# Patient Record
Sex: Female | Born: 1935 | Race: Black or African American | Hispanic: No | Marital: Single | Smoking: Never smoker
Health system: Southern US, Community
[De-identification: ages and names within clinical notes are randomized; demographics above are authoritative.]

## PROBLEM LIST (undated history)

## (undated) DIAGNOSIS — E119 Type 2 diabetes mellitus without complications: Secondary | ICD-10-CM

## (undated) DIAGNOSIS — E213 Hyperparathyroidism, unspecified: Secondary | ICD-10-CM

## (undated) DIAGNOSIS — M810 Age-related osteoporosis without current pathological fracture: Secondary | ICD-10-CM

## (undated) DIAGNOSIS — Z8679 Personal history of other diseases of the circulatory system: Secondary | ICD-10-CM

## (undated) DIAGNOSIS — I10 Essential (primary) hypertension: Secondary | ICD-10-CM

## (undated) DIAGNOSIS — E785 Hyperlipidemia, unspecified: Secondary | ICD-10-CM

## (undated) DIAGNOSIS — G5600 Carpal tunnel syndrome, unspecified upper limb: Secondary | ICD-10-CM

## (undated) HISTORY — DX: Carpal tunnel syndrome, unspecified upper limb: G56.00

## (undated) HISTORY — DX: Hyperlipidemia, unspecified: E78.5

## (undated) HISTORY — PX: ABDOMINAL HYSTERECTOMY: SHX81

## (undated) HISTORY — DX: Essential (primary) hypertension: I10

## (undated) HISTORY — PX: OTHER SURGICAL HISTORY: SHX169

## (undated) HISTORY — DX: Personal history of other diseases of the circulatory system: Z86.79

## (undated) HISTORY — DX: Age-related osteoporosis without current pathological fracture: M81.0

## (undated) HISTORY — DX: Type 2 diabetes mellitus without complications: E11.9

## (undated) HISTORY — DX: Hyperparathyroidism, unspecified: E21.3

## (undated) HISTORY — PX: PARATHYROIDECTOMY: SHX19

---

## 2004-05-31 ENCOUNTER — Ambulatory Visit: Payer: Self-pay | Admitting: Internal Medicine

## 2004-11-12 ENCOUNTER — Ambulatory Visit: Payer: Self-pay | Admitting: Internal Medicine

## 2004-11-15 ENCOUNTER — Ambulatory Visit: Payer: Self-pay | Admitting: Internal Medicine

## 2004-12-21 ENCOUNTER — Ambulatory Visit: Payer: Self-pay | Admitting: Internal Medicine

## 2004-12-24 ENCOUNTER — Ambulatory Visit: Payer: Self-pay | Admitting: Gastroenterology

## 2005-01-11 ENCOUNTER — Ambulatory Visit: Payer: Self-pay | Admitting: Internal Medicine

## 2005-01-15 ENCOUNTER — Ambulatory Visit: Payer: Self-pay | Admitting: Internal Medicine

## 2005-02-15 ENCOUNTER — Ambulatory Visit: Payer: Self-pay | Admitting: Internal Medicine

## 2006-01-05 ENCOUNTER — Ambulatory Visit: Payer: Self-pay | Admitting: Internal Medicine

## 2007-03-29 ENCOUNTER — Ambulatory Visit: Payer: Self-pay | Admitting: Internal Medicine

## 2008-04-01 ENCOUNTER — Ambulatory Visit: Payer: Self-pay | Admitting: Internal Medicine

## 2009-04-02 ENCOUNTER — Ambulatory Visit: Payer: Self-pay | Admitting: Internal Medicine

## 2009-04-13 ENCOUNTER — Ambulatory Visit: Payer: Self-pay | Admitting: Internal Medicine

## 2009-11-04 ENCOUNTER — Ambulatory Visit: Payer: Self-pay | Admitting: Internal Medicine

## 2010-04-26 ENCOUNTER — Ambulatory Visit: Payer: Self-pay | Admitting: Internal Medicine

## 2010-08-25 ENCOUNTER — Ambulatory Visit: Payer: Self-pay | Admitting: Gastroenterology

## 2010-09-06 ENCOUNTER — Inpatient Hospital Stay: Payer: Self-pay | Admitting: Internal Medicine

## 2011-11-01 ENCOUNTER — Ambulatory Visit: Payer: Self-pay | Admitting: Internal Medicine

## 2011-11-01 LAB — HM MAMMOGRAPHY

## 2011-11-01 LAB — HM DEXA SCAN

## 2012-01-31 LAB — LIPID PANEL
Cholesterol: 238 mg/dL — AB (ref 0–200)
Triglycerides: 155 mg/dL (ref 40–160)

## 2012-01-31 LAB — BASIC METABOLIC PANEL: Sodium: 139 mmol/L (ref 137–147)

## 2012-01-31 LAB — CBC AND DIFFERENTIAL
HCT: 36 % (ref 36–46)
Hemoglobin: 12.1 g/dL (ref 12.0–16.0)
Platelets: 247 10*3/uL (ref 150–399)

## 2012-01-31 LAB — HEMOGLOBIN A1C: Hgb A1c MFr Bld: 7.1 % — AB (ref 4.0–6.0)

## 2012-02-22 ENCOUNTER — Ambulatory Visit: Payer: Self-pay | Admitting: Unknown Physician Specialty

## 2012-05-11 LAB — HM DIABETES EYE EXAM

## 2012-05-18 ENCOUNTER — Telehealth: Payer: Self-pay | Admitting: Internal Medicine

## 2012-05-18 NOTE — Telephone Encounter (Signed)
Refill request for januvia tab 100 mg Sig: take 1 tablet by mouth daily Patient does have an appt. On 1/717/14

## 2012-05-23 MED ORDER — PANTOPRAZOLE SODIUM 40 MG PO TBEC: 40.0000 mg | DELAYED_RELEASE_TABLET | Freq: Every day | ORAL | Status: DC

## 2012-05-23 MED ORDER — SITAGLIPTIN PHOSPHATE 100 MG PO TABS: 100.0000 mg | ORAL_TABLET | Freq: Every day | ORAL | Status: DC

## 2012-05-24 NOTE — Telephone Encounter (Signed)
Called to pharmacy by Ermalinda Barrios. 11/6

## 2012-08-02 ENCOUNTER — Encounter: Payer: Self-pay | Admitting: *Deleted

## 2012-08-03 ENCOUNTER — Ambulatory Visit (INDEPENDENT_AMBULATORY_CARE_PROVIDER_SITE_OTHER): Payer: Medicare Other | Admitting: Internal Medicine

## 2012-08-03 ENCOUNTER — Encounter: Payer: Self-pay | Admitting: Internal Medicine

## 2012-08-03 VITALS — BP 112/68 | HR 64 | Temp 98.7°F | Ht 65.0 in | Wt 163.5 lb

## 2012-08-03 DIAGNOSIS — R5381 Other malaise: Secondary | ICD-10-CM

## 2012-08-03 DIAGNOSIS — E213 Hyperparathyroidism, unspecified: Secondary | ICD-10-CM

## 2012-08-03 DIAGNOSIS — E119 Type 2 diabetes mellitus without complications: Secondary | ICD-10-CM | POA: Insufficient documentation

## 2012-08-03 DIAGNOSIS — D649 Anemia, unspecified: Secondary | ICD-10-CM

## 2012-08-03 DIAGNOSIS — E7800 Pure hypercholesterolemia, unspecified: Secondary | ICD-10-CM

## 2012-08-03 DIAGNOSIS — N289 Disorder of kidney and ureter, unspecified: Secondary | ICD-10-CM

## 2012-08-03 DIAGNOSIS — I10 Essential (primary) hypertension: Secondary | ICD-10-CM | POA: Insufficient documentation

## 2012-08-03 DIAGNOSIS — R5383 Other fatigue: Secondary | ICD-10-CM

## 2012-08-04 ENCOUNTER — Encounter: Payer: Self-pay | Admitting: Internal Medicine

## 2012-08-05 ENCOUNTER — Other Ambulatory Visit: Payer: Self-pay | Admitting: Internal Medicine

## 2012-08-05 DIAGNOSIS — E213 Hyperparathyroidism, unspecified: Secondary | ICD-10-CM | POA: Insufficient documentation

## 2012-08-05 DIAGNOSIS — N289 Disorder of kidney and ureter, unspecified: Secondary | ICD-10-CM | POA: Insufficient documentation

## 2012-08-05 MED ORDER — AMLODIPINE BESYLATE 2.5 MG PO TABS: 2.5000 mg | ORAL_TABLET | Freq: Every day | ORAL | Status: DC

## 2012-08-05 MED ORDER — GLIPIZIDE ER 2.5 MG PO TB24: 2.5000 mg | ORAL_TABLET | Freq: Every evening | ORAL | Status: DC

## 2012-08-05 MED ORDER — GLIPIZIDE ER 5 MG PO TB24: ORAL_TABLET | ORAL | Status: DC

## 2012-08-05 MED ORDER — HYDROCHLOROTHIAZIDE 25 MG PO TABS: 25.0000 mg | ORAL_TABLET | Freq: Every day | ORAL | Status: DC

## 2012-08-05 MED ORDER — FERROUS FUM-IRON POLYSACCH 162-115.2 MG PO CAPS: 1.0000 | ORAL_CAPSULE | Freq: Every day | ORAL | Status: AC

## 2012-08-05 NOTE — Assessment & Plan Note (Signed)
Colonoscopy as outlined.  EGD 09/08/10 revealed an esophageal stricture and gastritis.  Hgb 01/31/12 - 12.1.  Follow.  Recheck cbc with next labs.

## 2012-08-05 NOTE — Assessment & Plan Note (Signed)
Most recent calcium check wnl.  Follow.

## 2012-08-05 NOTE — Progress Notes (Signed)
Refilled meds (hctz, glipizide, amlodipine and tandem)

## 2012-08-05 NOTE — Assessment & Plan Note (Signed)
Sugars as outlined.  Same medication.  Low carb diet/diabetic diet.  Check met b and a1c.

## 2012-08-05 NOTE — Assessment & Plan Note (Signed)
She declines cholesterol medication.  Low cholesterol diet and exercise.  Follow.  Check lipid panel.

## 2012-08-05 NOTE — Assessment & Plan Note (Signed)
Blood pressure doing well.  Check metabolic panel.  Same medication.

## 2012-08-05 NOTE — Assessment & Plan Note (Signed)
Previous Cr 1.5.  Recheck to confirm stable.

## 2012-08-05 NOTE — Progress Notes (Signed)
Subjective:    Patient ID: Ana Higgins, female    DOB: October 20, 1925, 79 y.o.   MRN: [id number]  HPI 79 year old female with past history hyperparathyroidism, hypertension, hypercholesterolemia and diabetes who comes in today for a scheduled follow up.  She states she is doing well.  Feels good. Staying active.  No cardiac symptoms with increased activity or exertion.  Breathing stable.  Eating and drinking well.  AM sugars averaging 120-140.  PM sugars - 140-180.  Blood pressure has been doing well.  Some occasional diarrhea.  Overall feels good.   Past Medical History  Diagnosis Date  . Hyperlipidemia   . Hypertension   . Diabetes mellitus without complication   . Osteoporosis   . Hyperparathyroidism   . Carpal tunnel syndrome   . Hx of Raynaud's syndrome     Positive for rheumoid factor, negative anti-CCP antibodies, Nedative FANA    Current Outpatient Prescriptions on File Prior to Visit  Medication Sig Dispense Refill  . amLODipine (NORVASC) 2.5 MG tablet Take 2.5 mg by mouth daily.      Marland Kitchen aspirin EC 81 MG tablet Take 81 mg by mouth daily.      Marland Kitchen FeFum-FePo-FA-B Cmp-C-Zn-Mn-Cu (TANDEM PLUS) 162-115.2-1 MG CAPS Take 1 capsule by mouth daily.      . fish oil-omega-3 fatty acids 1000 MG capsule Take 2 g by mouth daily.      Marland Kitchen glipiZIDE (GLUCOTROL XL) 5 MG 24 hr tablet Take 5 mg by mouth daily.       . hydrochlorothiazide (HYDRODIURIL) 25 MG tablet Take 25 mg by mouth daily.      Marland Kitchen olmesartan (BENICAR) 20 MG tablet Take 20 mg by mouth daily.      . pantoprazole (PROTONIX) 40 MG tablet Take 1 tablet (40 mg total) by mouth daily. Order reference # [id number]  90 tablet  1  . sitaGLIPtin (JANUVIA) 100 MG tablet Take 1 tablet (100 mg total) by mouth daily. Order reference # [id number]  90 tablet  1  . vitamin C (ASCORBIC ACID) 500 MG tablet Take 500 mg by mouth daily.      Marland Kitchen alendronate (FOSAMAX) 70 MG tablet Take 70 mg by mouth every 7 (seven) days. Take with a full glass of water on an  empty stomach.        Review of Systems Patient denies any headache, lightheadedness or dizziness.  No significant sinus or allergy symptoms.   No chest pain, tightness or palpitations.  No increased shortness of breath, cough or congestion.  No nausea or vomiting.  No abdominal pain or cramping.  No bowel change, such as  constipation, BRBPR or melana.  Some occasional diarrhea.  No urine change.        Objective:   Physical Exam Filed Vitals:   08/03/12 1150  BP: 112/68  Pulse: 64  Temp: 98.7 F (41.9 C)   79 year old female in no acute distress.   HEENT:  Nares - clear.  OP- without lesions or erythema.  NECK:  Supple, nontender.  No audible bruit.   HEART:  Appears to be regular. LUNGS:  Without crackles or wheezing audible.  Respirations even and unlabored.   RADIAL PULSE:  Equal bilaterally.  ABDOMEN:  Soft, nontender.  No audible abdominal bruit.   EXTREMITIES:  No increased edema to be present.  Feet without lesions.                  Assessment & Plan:  CARDIOVASCULAR.  Asymptomatic.   HEALTH MAINTENANCE.  Physical 10/12/11.  She is s/p hysterectomy and does not require yearly paps.  Colonoscopy 08/19/10 - internal hemorrhoids and diverticulosis.  Bone density 11/01/11 - osteopenia.  No significant change.  Mammogram 11/01/11 - BiRADS II.

## 2012-08-07 ENCOUNTER — Other Ambulatory Visit (INDEPENDENT_AMBULATORY_CARE_PROVIDER_SITE_OTHER): Payer: Medicare Other

## 2012-08-07 DIAGNOSIS — E7800 Pure hypercholesterolemia, unspecified: Secondary | ICD-10-CM

## 2012-08-07 DIAGNOSIS — D649 Anemia, unspecified: Secondary | ICD-10-CM

## 2012-08-07 DIAGNOSIS — E119 Type 2 diabetes mellitus without complications: Secondary | ICD-10-CM

## 2012-08-07 DIAGNOSIS — R5381 Other malaise: Secondary | ICD-10-CM

## 2012-08-07 DIAGNOSIS — R5383 Other fatigue: Secondary | ICD-10-CM

## 2012-08-07 LAB — LIPID PANEL
Cholesterol: 229 mg/dL — ABNORMAL HIGH (ref 0–200)
Total CHOL/HDL Ratio: 5
Triglycerides: 160 mg/dL — ABNORMAL HIGH (ref 0.0–149.0)
VLDL: 32 mg/dL (ref 0.0–40.0)

## 2012-08-07 LAB — CBC WITH DIFFERENTIAL/PLATELET
Basophils Relative: 0.2 % (ref 0.0–3.0)
Eosinophils Relative: 1.6 % (ref 0.0–5.0)
HCT: 34.5 % — ABNORMAL LOW (ref 36.0–46.0)
Lymphs Abs: 1.7 10*3/uL (ref 0.7–4.0)
MCV: 83.7 fl (ref 78.0–100.0)
Monocytes Absolute: 0.4 10*3/uL (ref 0.1–1.0)
RBC: 4.12 Mil/uL (ref 3.87–5.11)
WBC: 4.8 10*3/uL (ref 4.5–10.5)

## 2012-08-07 LAB — BASIC METABOLIC PANEL
Calcium: 9.2 mg/dL (ref 8.4–10.5)
GFR: 53.44 mL/min — ABNORMAL LOW (ref >60.00)
Sodium: 138 mEq/L (ref 135–145)

## 2012-08-07 LAB — FERRITIN: Ferritin: 75.3 ng/mL (ref 10.0–291.0)

## 2012-08-07 LAB — HEPATIC FUNCTION PANEL
Alkaline Phosphatase: 43 U/L (ref 39–117)
Bilirubin, Direct: 0 mg/dL (ref 0.0–0.3)

## 2012-08-08 ENCOUNTER — Other Ambulatory Visit: Payer: Self-pay | Admitting: Internal Medicine

## 2012-08-08 DIAGNOSIS — D649 Anemia, unspecified: Secondary | ICD-10-CM

## 2012-08-08 NOTE — Progress Notes (Signed)
Follow up cbc ordered.   

## 2012-08-29 ENCOUNTER — Other Ambulatory Visit (INDEPENDENT_AMBULATORY_CARE_PROVIDER_SITE_OTHER): Payer: Medicare Other

## 2012-08-29 DIAGNOSIS — D649 Anemia, unspecified: Secondary | ICD-10-CM

## 2012-08-29 LAB — CBC WITH DIFFERENTIAL/PLATELET
Basophils Absolute: 0 10*3/uL (ref 0.0–0.1)
Eosinophils Absolute: 0.1 10*3/uL (ref 0.0–0.7)
Hemoglobin: 11.8 g/dL — ABNORMAL LOW (ref 12.0–15.0)
Lymphocytes Relative: 37.2 % (ref 12.0–46.0)
MCHC: 33.8 g/dL (ref 30.0–36.0)
MCV: 83.3 fl (ref 78.0–100.0)
Monocytes Absolute: 0.4 10*3/uL (ref 0.1–1.0)
Neutro Abs: 2.9 10*3/uL (ref 1.4–7.7)
Neutrophils Relative %: 52.4 % (ref 43.0–77.0)
RDW: 13.9 % (ref 11.5–14.6)

## 2012-08-30 ENCOUNTER — Encounter: Payer: Self-pay | Admitting: Internal Medicine

## 2012-09-27 ENCOUNTER — Other Ambulatory Visit: Payer: Self-pay | Admitting: *Deleted

## 2012-09-28 MED ORDER — OLMESARTAN MEDOXOMIL 20 MG PO TABS: 20.0000 mg | ORAL_TABLET | Freq: Every day | ORAL | Status: DC

## 2012-09-28 NOTE — Telephone Encounter (Signed)
Sent in to pharmacy.  

## 2012-11-01 ENCOUNTER — Other Ambulatory Visit: Payer: Self-pay | Admitting: Internal Medicine

## 2012-11-06 ENCOUNTER — Encounter: Payer: Self-pay | Admitting: Internal Medicine

## 2012-11-06 ENCOUNTER — Ambulatory Visit (INDEPENDENT_AMBULATORY_CARE_PROVIDER_SITE_OTHER): Payer: Medicare Other | Admitting: Internal Medicine

## 2012-11-06 VITALS — BP 122/60 | HR 69 | Temp 98.2°F | Ht 64.25 in | Wt 164.0 lb

## 2012-11-06 DIAGNOSIS — Z1239 Encounter for other screening for malignant neoplasm of breast: Secondary | ICD-10-CM

## 2012-11-06 DIAGNOSIS — E7800 Pure hypercholesterolemia, unspecified: Secondary | ICD-10-CM

## 2012-11-06 DIAGNOSIS — E213 Hyperparathyroidism, unspecified: Secondary | ICD-10-CM

## 2012-11-06 DIAGNOSIS — D649 Anemia, unspecified: Secondary | ICD-10-CM

## 2012-11-06 DIAGNOSIS — E119 Type 2 diabetes mellitus without complications: Secondary | ICD-10-CM

## 2012-11-06 DIAGNOSIS — I10 Essential (primary) hypertension: Secondary | ICD-10-CM

## 2012-11-06 DIAGNOSIS — N289 Disorder of kidney and ureter, unspecified: Secondary | ICD-10-CM

## 2012-11-06 NOTE — Progress Notes (Signed)
Subjective:    Patient ID: Ana Higgins, female    DOB: [birth year], 79 y.o.   MRN: [id number]  Constipation  79 year old female with past history hyperparathyroidism, hypertension, hypercholesterolemia and diabetes who comes in today to follow up on these issues as well as for a complete physical exam.  She states she is doing well.  Feels good. Staying active.  No cardiac symptoms with increased activity or exertion.  Breathing stable.  Eating and drinking well.  AM sugars averaging 130-140s.  PM sugars - 140-180.  Blood pressure has been doing well.  Some constipation.  Takes a stool softener and this helps.  No blood in her stool.  Overall feels good.    Past Medical History  Diagnosis Date  . Hyperlipidemia   . Hypertension   . Diabetes mellitus without complication   . Osteoporosis   . Hyperparathyroidism   . Carpal tunnel syndrome   . Hx of Raynaud's syndrome     Positive for rheumoid factor, negative anti-CCP antibodies, Nedative FANA    Current Outpatient Prescriptions on File Prior to Visit  Medication Sig Dispense Refill  . amLODipine (NORVASC) 2.5 MG tablet Take 1 tablet (2.5 mg total) by mouth daily.  90 tablet  3  . aspirin EC 81 MG tablet Take 81 mg by mouth daily.      . Calcium Carbonate-Vitamin D (CALCIUM 500 + D PO) Take by mouth daily.      . ferrous fumarate-iron polysaccharide complex (TANDEM) 162-115.2 MG CAPS Take 1 capsule by mouth daily with breakfast.  90 capsule  3  . fish oil-omega-3 fatty acids 1000 MG capsule Take 2 g by mouth daily.      Marland Kitchen glipiZIDE (GLIPIZIDE XL) 2.5 MG 24 hr tablet Take 1 tablet (2.5 mg total) by mouth every evening. Take with supper  90 tablet  3  . glipiZIDE (GLUCOTROL XL) 5 MG 24 hr tablet Take one tablet q am.  90 tablet  3  . hydrochlorothiazide (HYDRODIURIL) 25 MG tablet Take 1 tablet (25 mg total) by mouth daily.  90 tablet  3  . JANUVIA 100 MG tablet Take 1 tablet by mouth  daily  90 tablet  1  . olmesartan (BENICAR) 20 MG  tablet Take 1 tablet (20 mg total) by mouth daily.  30 tablet  5  . pantoprazole (PROTONIX) 40 MG tablet Take 1 tablet by mouth  daily  90 tablet  1  . vitamin C (ASCORBIC ACID) 500 MG tablet Take 500 mg by mouth daily.      Marland Kitchen alendronate (FOSAMAX) 70 MG tablet Take 70 mg by mouth every 7 (seven) days. Take with a full glass of water on an empty stomach.       No current facility-administered medications on file prior to visit.    Review of Systems  Gastrointestinal: Positive for constipation.  Patient denies any headache, lightheadedness or dizziness.  No significant sinus or allergy symptoms.   No chest pain, tightness or palpitations.  No increased shortness of breath, cough or congestion.  No nausea or vomiting.  No acid reflux.  No abdominal pain or cramping.  No bowel change, such as  BRBPR or melana.  Some occasional constipation.  No urine change.        Objective:   Physical Exam  Filed Vitals:   11/06/12 1320  BP: 122/60  Pulse: 69  Temp: 98.2 F (36.8 C)   Blood pressure recheck:  62/32  79 year old female in  no acute distress.   HEENT:  Nares- clear.  Oropharynx - without lesions. NECK:  Supple.  Nontender.  No audible bruit.  HEART:  Appears to be regular. LUNGS:  No crackles or wheezing audible.  Respirations even and unlabored.  RADIAL PULSE:  Equal bilaterally.    BREASTS:  No nipple discharge or nipple retraction present.  Could not appreciate any distinct nodules or axillary adenopathy.  ABDOMEN:  Soft, nontender.  Bowel sounds present and normal.  No audible abdominal bruit.  GU:  She declined.    EXTREMITIES:  No increased edema present.  DP pulses palpable and equal bilaterally.            Assessment & Plan:  CARDIOVASCULAR.  Asymptomatic.   HEALTH MAINTENANCE.  Physical today (11/06/12).  She is s/p hysterectomy and does not require yearly paps.  Colonoscopy 08/19/10 - internal hemorrhoids and diverticulosis.  Bone density 11/01/11 - osteopenia.  No  significant change.  Mammogram 11/01/11 - BiRADS II.  Schedule a follow up mammogram.

## 2012-11-07 ENCOUNTER — Encounter: Payer: Self-pay | Admitting: Internal Medicine

## 2012-11-07 NOTE — Assessment & Plan Note (Signed)
Blood pressure doing well.  Check metabolic panel.  Same medication.

## 2012-11-07 NOTE — Assessment & Plan Note (Addendum)
Last Cr 1.2.  Recheck to confirm stable.  Check urine microalbumin/cr ratio.

## 2012-11-07 NOTE — Assessment & Plan Note (Signed)
She declines cholesterol medication.  Low cholesterol diet and exercise.  Follow.  Check lipid panel.

## 2012-11-07 NOTE — Assessment & Plan Note (Signed)
Colonoscopy as outlined.  EGD 09/08/10 revealed an esophageal stricture and gastritis.  Hgb 01/31/12 - 12.1.  Follow.  Recheck cbc with next labs.

## 2012-11-07 NOTE — Assessment & Plan Note (Signed)
Most recent calcium check wnl.  Follow.

## 2012-11-07 NOTE — Assessment & Plan Note (Signed)
Sugars as outlined.  Same medication.  Low carb diet/diabetic diet.  Check met b and a1c.  Hold on making adjustments.

## 2012-11-13 ENCOUNTER — Telehealth: Payer: Self-pay | Admitting: Internal Medicine

## 2012-11-13 ENCOUNTER — Encounter: Payer: Self-pay | Admitting: Internal Medicine

## 2012-11-13 ENCOUNTER — Other Ambulatory Visit: Payer: Self-pay | Admitting: Internal Medicine

## 2012-11-13 NOTE — Progress Notes (Signed)
Removed fosamax from medication list.   Pt reports not taking.

## 2012-11-13 NOTE — Telephone Encounter (Signed)
Patient called and stated she doesn't take alendronate (FOSAMAX) 70 MG tablet can we please change in her chart?

## 2012-11-13 NOTE — Telephone Encounter (Signed)
Removed

## 2012-11-21 ENCOUNTER — Ambulatory Visit: Payer: Self-pay | Admitting: Internal Medicine

## 2012-11-22 ENCOUNTER — Telehealth: Payer: Self-pay | Admitting: *Deleted

## 2012-11-22 NOTE — Telephone Encounter (Signed)
Left detailed message to notify pt that her sugar is overall improving, & to continue to record. We will continue to follow

## 2012-12-03 ENCOUNTER — Ambulatory Visit: Payer: Self-pay | Admitting: Internal Medicine

## 2013-01-30 ENCOUNTER — Encounter: Payer: Self-pay | Admitting: Internal Medicine

## 2013-03-25 ENCOUNTER — Other Ambulatory Visit: Payer: Self-pay | Admitting: *Deleted

## 2013-03-25 MED ORDER — OLMESARTAN MEDOXOMIL 20 MG PO TABS: 20.0000 mg | ORAL_TABLET | Freq: Every day | ORAL | Status: DC

## 2013-04-08 ENCOUNTER — Encounter: Payer: Self-pay | Admitting: Internal Medicine

## 2013-04-08 ENCOUNTER — Ambulatory Visit (INDEPENDENT_AMBULATORY_CARE_PROVIDER_SITE_OTHER): Payer: Medicare Other | Admitting: Internal Medicine

## 2013-04-08 VITALS — BP 122/60 | HR 75 | Temp 98.3°F | Ht 64.25 in | Wt 162.5 lb

## 2013-04-08 DIAGNOSIS — I10 Essential (primary) hypertension: Secondary | ICD-10-CM

## 2013-04-08 DIAGNOSIS — E213 Hyperparathyroidism, unspecified: Secondary | ICD-10-CM

## 2013-04-08 DIAGNOSIS — E7800 Pure hypercholesterolemia, unspecified: Secondary | ICD-10-CM

## 2013-04-08 DIAGNOSIS — D649 Anemia, unspecified: Secondary | ICD-10-CM

## 2013-04-08 DIAGNOSIS — E119 Type 2 diabetes mellitus without complications: Secondary | ICD-10-CM

## 2013-04-08 DIAGNOSIS — N289 Disorder of kidney and ureter, unspecified: Secondary | ICD-10-CM

## 2013-04-11 ENCOUNTER — Encounter: Payer: Self-pay | Admitting: Internal Medicine

## 2013-04-11 NOTE — Assessment & Plan Note (Signed)
Most recent calcium check wnl.  Follow.

## 2013-04-11 NOTE — Assessment & Plan Note (Addendum)
Colonoscopy as outlined.  EGD 09/08/10 revealed an esophageal stricture and gastritis.  Hgb 2/14 - 11.8.  Follow.  Recheck cbc with next labs.

## 2013-04-11 NOTE — Progress Notes (Signed)
Subjective:    Patient ID: Ana Higgins, female    DOB: [birth year], 79 y.o.   MRN: [id number]  HPI 79 year old female with past history hyperparathyroidism, hypertension, hypercholesterolemia and diabetes who comes in today for a scheduled follow up.  She states she is doing well.  Feels good. Staying active.  No cardiac symptoms with increased activity or exertion.  Breathing stable.  Eating and drinking well.  AM sugars averaging 120-140.  PM sugars - 120-180.  Blood pressure has been doing well.  Overall feels good.   Past Medical History  Diagnosis Date  . Hyperlipidemia   . Hypertension   . Diabetes mellitus without complication   . Osteoporosis   . Hyperparathyroidism   . Carpal tunnel syndrome   . Hx of Raynaud's syndrome     Positive for rheumoid factor, negative anti-CCP antibodies, Nedative FANA    Current Outpatient Prescriptions on File Prior to Visit  Medication Sig Dispense Refill  . amLODipine (NORVASC) 2.5 MG tablet Take 1 tablet (2.5 mg total) by mouth daily.  90 tablet  3  . aspirin EC 81 MG tablet Take 81 mg by mouth daily.      . Calcium Carbonate-Vitamin D (CALCIUM 500 + D PO) Take by mouth daily.      . ferrous fumarate-iron polysaccharide complex (TANDEM) 162-115.2 MG CAPS Take 1 capsule by mouth daily with breakfast.  90 capsule  3  . fish oil-omega-3 fatty acids 1000 MG capsule Take 2 g by mouth daily.      Marland Kitchen glipiZIDE (GLIPIZIDE XL) 2.5 MG 24 hr tablet Take 1 tablet (2.5 mg total) by mouth every evening. Take with supper  90 tablet  3  . glipiZIDE (GLUCOTROL XL) 5 MG 24 hr tablet Take one tablet q am.  90 tablet  3  . hydrochlorothiazide (HYDRODIURIL) 25 MG tablet Take 1 tablet (25 mg total) by mouth daily.  90 tablet  3  . JANUVIA 100 MG tablet Take 1 tablet by mouth  daily  90 tablet  1  . olmesartan (BENICAR) 20 MG tablet Take 1 tablet (20 mg total) by mouth daily.  30 tablet  5  . pantoprazole (PROTONIX) 40 MG tablet Take 1 tablet by mouth  daily  90  tablet  1  . vitamin C (ASCORBIC ACID) 500 MG tablet Take 500 mg by mouth daily.       No current facility-administered medications on file prior to visit.    Review of Systems Patient denies any headache, lightheadedness or dizziness.  No significant sinus or allergy symptoms.   No chest pain, tightness or palpitations.  No increased shortness of breath, cough or congestion.  No nausea or vomiting.  No abdominal pain or cramping.  No bowel change, such as  constipation, BRBPR or melana.  No urine change.  Overall she feels she is doing well.      Objective:   Physical Exam  Filed Vitals:   04/08/13 1509  BP: 122/60  Pulse: 75  Temp: 98.3 F (49.62 C)   79 year old female in no acute distress.   HEENT:  Nares - clear.  OP- without lesions or erythema.  NECK:  Supple, nontender.  No audible bruit.   HEART:  Appears to be regular. LUNGS:  Without crackles or wheezing audible.  Respirations even and unlabored.   RADIAL PULSE:  Equal bilaterally.  ABDOMEN:  Soft, nontender.  No audible abdominal bruit.   EXTREMITIES:  No increased edema to be present.  Feet without lesions.                  Assessment & Plan:  CARDIOVASCULAR.  Asymptomatic.   HEALTH MAINTENANCE.  Physical 11/06/12.  She is s/p hysterectomy and does not require yearly paps.  Colonoscopy 08/19/10 - internal hemorrhoids and diverticulosis.  Bone density 11/01/11 - osteopenia.  No significant change.  Mammogram 11/21/12 recommended f/u views.  Follow up mammogram 12/03/12 - Birads I.

## 2013-04-11 NOTE — Assessment & Plan Note (Signed)
Last Cr 1.2.  Recheck to confirm stable.  Check urine microalbumin/cr ratio.

## 2013-04-11 NOTE — Assessment & Plan Note (Signed)
She declines cholesterol medication.  Low cholesterol diet and exercise.  Follow.  Check lipid panel.

## 2013-04-11 NOTE — Assessment & Plan Note (Signed)
Blood pressure doing well.  Check metabolic panel.  Same medication.

## 2013-04-11 NOTE — Assessment & Plan Note (Signed)
Sugars as outlined.  Same medication.  Low carb diet/diabetic diet.  Check met b and a1c.  Hold on making adjustments.  Up to date with eye exams.

## 2013-04-17 ENCOUNTER — Other Ambulatory Visit (INDEPENDENT_AMBULATORY_CARE_PROVIDER_SITE_OTHER): Payer: Medicare Other

## 2013-04-17 DIAGNOSIS — E119 Type 2 diabetes mellitus without complications: Secondary | ICD-10-CM

## 2013-04-17 DIAGNOSIS — E7800 Pure hypercholesterolemia, unspecified: Secondary | ICD-10-CM

## 2013-04-17 DIAGNOSIS — D649 Anemia, unspecified: Secondary | ICD-10-CM

## 2013-04-17 LAB — COMPREHENSIVE METABOLIC PANEL
ALT: 17 U/L (ref 0–35)
BUN: 28 mg/dL — ABNORMAL HIGH (ref 6–23)
CO2: 32 mEq/L (ref 19–32)
Calcium: 9.4 mg/dL (ref 8.4–10.5)
Chloride: 100 mEq/L (ref 96–112)
Creatinine, Ser: 1.2 mg/dL (ref 0.4–1.2)
GFR: 55.97 mL/min — ABNORMAL LOW (ref >60.00)

## 2013-04-17 LAB — CBC WITH DIFFERENTIAL/PLATELET
Basophils Absolute: 0 10*3/uL (ref 0.0–0.1)
Hemoglobin: 11.9 g/dL — ABNORMAL LOW (ref 12.0–15.0)
Lymphocytes Relative: 35.4 % (ref 12.0–46.0)
Monocytes Relative: 8.1 % (ref 3.0–12.0)
Neutro Abs: 3.1 10*3/uL (ref 1.4–7.7)
Neutrophils Relative %: 54.5 % (ref 43.0–77.0)
RBC: 4.27 Mil/uL (ref 3.87–5.11)
RDW: 14.1 % (ref 11.5–14.6)

## 2013-04-17 LAB — LIPID PANEL
HDL: 54.2 mg/dL (ref >39.00)
Total CHOL/HDL Ratio: 4
Triglycerides: 148 mg/dL (ref 0.0–149.0)

## 2013-04-17 LAB — HEMOGLOBIN A1C: Hgb A1c MFr Bld: 7.2 % — ABNORMAL HIGH (ref 4.6–6.5)

## 2013-04-18 LAB — MICROALBUMIN / CREATININE URINE RATIO
Microalb Creat Ratio: 0.6 mg/g (ref 0.0–30.0)
Microalb, Ur: 0.6 mg/dL (ref 0.0–1.9)

## 2013-04-22 ENCOUNTER — Telehealth: Payer: Self-pay | Admitting: Internal Medicine

## 2013-04-22 LAB — LDL CHOLESTEROL, DIRECT: Direct LDL: 149.7 mg/dL

## 2013-04-22 NOTE — Telephone Encounter (Signed)
All of her labs show collected on 04/17/13 -but no results are found.  Can I get the results.  Thanks.

## 2013-04-23 NOTE — Telephone Encounter (Signed)
They have resulted

## 2013-04-29 ENCOUNTER — Other Ambulatory Visit: Payer: Self-pay | Admitting: *Deleted

## 2013-04-29 MED ORDER — AMLODIPINE BESYLATE 2.5 MG PO TABS: 2.5000 mg | ORAL_TABLET | Freq: Every day | ORAL | Status: AC

## 2013-04-29 MED ORDER — SITAGLIPTIN PHOSPHATE 100 MG PO TABS: ORAL_TABLET | ORAL | Status: AC

## 2013-04-29 MED ORDER — PANTOPRAZOLE SODIUM 40 MG PO TBEC: DELAYED_RELEASE_TABLET | ORAL | Status: AC

## 2013-04-29 MED ORDER — GLIPIZIDE ER 2.5 MG PO TB24: 2.5000 mg | ORAL_TABLET | Freq: Every evening | ORAL | Status: AC

## 2013-05-09 ENCOUNTER — Encounter: Payer: Self-pay | Admitting: *Deleted

## 2013-05-12 ENCOUNTER — Other Ambulatory Visit: Payer: Self-pay | Admitting: Internal Medicine

## 2013-05-12 MED ORDER — OLMESARTAN MEDOXOMIL 20 MG PO TABS: 20.0000 mg | ORAL_TABLET | Freq: Every day | ORAL | Status: AC

## 2013-05-12 NOTE — Progress Notes (Signed)
rx sent to [facility] - benicar #90 with three refills

## 2013-05-25 ENCOUNTER — Other Ambulatory Visit: Payer: Self-pay | Admitting: Internal Medicine

## 2013-05-28 ENCOUNTER — Other Ambulatory Visit: Payer: Self-pay | Admitting: *Deleted

## 2013-05-30 ENCOUNTER — Other Ambulatory Visit: Payer: Self-pay | Admitting: *Deleted

## 2013-05-30 NOTE — Telephone Encounter (Signed)
I am ok to refill this, but it looks like it was sent in on 05/12/13.  Wast that one sent to the wrong place?  If so, ok to refill again.

## 2013-05-31 MED ORDER — HYDROCHLOROTHIAZIDE 25 MG PO TABS: 25.0000 mg | ORAL_TABLET | Freq: Every day | ORAL | Status: AC

## 2013-05-31 NOTE — Telephone Encounter (Signed)
See my message.

## 2013-05-31 NOTE — Telephone Encounter (Signed)
See message.

## 2013-07-15 ENCOUNTER — Ambulatory Visit: Payer: Self-pay | Admitting: Internal Medicine

## 2013-07-15 ENCOUNTER — Ambulatory Visit (INDEPENDENT_AMBULATORY_CARE_PROVIDER_SITE_OTHER): Payer: Medicare Other | Admitting: Internal Medicine

## 2013-07-15 ENCOUNTER — Encounter: Payer: Self-pay | Admitting: Internal Medicine

## 2013-07-15 VITALS — BP 120/60 | HR 88 | Temp 98.4°F | Ht 64.25 in | Wt 157.8 lb

## 2013-07-15 DIAGNOSIS — R071 Chest pain on breathing: Secondary | ICD-10-CM

## 2013-07-15 DIAGNOSIS — R0789 Other chest pain: Secondary | ICD-10-CM

## 2013-07-15 DIAGNOSIS — E119 Type 2 diabetes mellitus without complications: Secondary | ICD-10-CM

## 2013-07-15 DIAGNOSIS — M549 Dorsalgia, unspecified: Secondary | ICD-10-CM

## 2013-07-15 MED ORDER — GABAPENTIN 100 MG PO CAPS: ORAL_CAPSULE | ORAL | Status: DC

## 2013-07-15 NOTE — Progress Notes (Signed)
Pre-visit discussion using our clinic review tool. No additional management support is needed unless otherwise documented below in the visit note.  

## 2013-07-16 ENCOUNTER — Encounter: Payer: Self-pay | Admitting: *Deleted

## 2013-07-16 LAB — CBC WITH DIFFERENTIAL/PLATELET
Basophils Absolute: 0 10*3/uL (ref 0.0–0.1)
Basophils Relative: 0.3 % (ref 0.0–3.0)
Eosinophils Absolute: 0.1 10*3/uL (ref 0.0–0.7)
HCT: 35.5 % — ABNORMAL LOW (ref 36.0–46.0)
MCHC: 33.5 g/dL (ref 30.0–36.0)
MCV: 84 fl (ref 78.0–100.0)
Monocytes Absolute: 0.6 10*3/uL (ref 0.1–1.0)
Neutro Abs: 4.4 10*3/uL (ref 1.4–7.7)
Neutrophils Relative %: 63.7 % (ref 43.0–77.0)
RBC: 4.22 Mil/uL (ref 3.87–5.11)
RDW: 13.8 % (ref 11.5–14.6)

## 2013-07-18 ENCOUNTER — Encounter: Payer: Self-pay | Admitting: Internal Medicine

## 2013-07-18 DIAGNOSIS — R0789 Other chest pain: Secondary | ICD-10-CM | POA: Insufficient documentation

## 2013-07-18 NOTE — Assessment & Plan Note (Signed)
Pain under left breast, left lateral side and posterior back.  Minimal pain to palpation over the thoracic spine.  No significant rash.  Minimal erythema beneath the left breast.  No rash c/w shingles.  Question if neuropathic pain.  Present for two weeks.  Check cxr and thoracic spine xray.  neurontin 100mg  tid.  Increase as needed.  Follow.  Further w/up pending results.

## 2013-07-18 NOTE — Assessment & Plan Note (Signed)
Sugars increased recently with increased pain.  Same medication regimen.  Follow.

## 2013-07-18 NOTE — Progress Notes (Signed)
Subjective:    Patient ID: Ana Higgins, female    DOB: [birth year], 80 y.o.   MRN: [id number]  Rash  80 year old female with past history hyperparathyroidism, hypertension, hypercholesterolemia and diabetes who comes in today as a work in with concerns regarding pain around her left side and under her left breast.  No significant rash.  Pain present for two weeks.  Hurts with minimal touching and palpation.  No sob.  No pain with deep breathing.  Eating and drinking, but reports decreased appetite.  No vomiting.  No nausea.  Needs something for the pain.  Affecting her sleep.  No injury or trauma.     Past Medical History  Diagnosis Date  . Hyperlipidemia   . Hypertension   . Diabetes mellitus without complication   . Osteoporosis   . Hyperparathyroidism   . Carpal tunnel syndrome   . Hx of Raynaud's syndrome     Positive for rheumoid factor, negative anti-CCP antibodies, Nedative FANA    Current Outpatient Prescriptions on File Prior to Visit  Medication Sig Dispense Refill  . amLODipine (NORVASC) 2.5 MG tablet Take 1 tablet (2.5 mg total) by mouth daily.  90 tablet  1  . aspirin EC 81 MG tablet Take 81 mg by mouth daily.      . Calcium Carbonate-Vitamin D (CALCIUM 500 + D PO) Take by mouth daily.      . ferrous fumarate-iron polysaccharide complex (TANDEM) 162-115.2 MG CAPS Take 1 capsule by mouth daily with breakfast.  90 capsule  3  . fish oil-omega-3 fatty acids 1000 MG capsule Take 2 g by mouth daily.      Marland Kitchen. glipiZIDE (GLIPIZIDE XL) 2.5 MG 24 hr tablet Take 1 tablet (2.5 mg total) by mouth every evening. Take with supper  90 tablet  1  . glipiZIDE (GLUCOTROL XL) 5 MG 24 hr tablet Take 1 tablet by mouth in  the morning  90 tablet  1  . hydrochlorothiazide (HYDRODIURIL) 25 MG tablet Take 1 tablet (25 mg total) by mouth daily.  90 tablet  3  . olmesartan (BENICAR) 20 MG tablet Take 1 tablet (20 mg total) by mouth daily.  90 tablet  3  . pantoprazole (PROTONIX) 40 MG tablet Take 1  tablet by mouth  daily  90 tablet  1  . sitaGLIPtin (JANUVIA) 100 MG tablet Take 1 tablet by mouth  daily  90 tablet  1  . vitamin C (ASCORBIC ACID) 500 MG tablet Take 500 mg by mouth daily.       No current facility-administered medications on file prior to visit.    Review of Systems  Skin: Positive for rash.  Patient denies any headache, lightheadedness or dizziness.  No significant sinus or allergy symptoms.   No chest tightness or palpitations.  No increased shortness of breath, cough or congestion.  No nausea or vomiting.  No abdominal pain or cramping.  No bowel change, such as  constipation, BRBPR or melana.  No urine change.  Pain as outlined.  Radiates - left side and under left breast.       Objective:   Physical Exam  Filed Vitals:   07/15/13 1535  BP: 120/60  Pulse: 88  Temp: 98.4 F (6136.829 C)   80 year old female in no acute distress.   NECK:  Supple, nontender.  No audible bruit.   HEART:  Appears to be regular. LUNGS:  Without crackles or wheezing audible.  Respirations even and unlabored.  Good breath  sounds bilaterally. CHEST WALL:  Increased pain to palpation and light touch left lateral side, flank and under her left breast.  Minimal erythema under left breast.  No rash c/w shingles.   RADIAL PULSE:  Equal bilaterally.  ABDOMEN:  Soft, nontender.  No audible abdominal bruit.                   Assessment & Plan:  CARDIOVASCULAR.  Asymptomatic.   HEALTH MAINTENANCE.  Physical 11/06/12.  She is s/p hysterectomy and does not require yearly paps.  Colonoscopy 08/19/10 - internal hemorrhoids and diverticulosis.  Bone density 11/01/11 - osteopenia.  No significant change.  Mammogram 11/21/12 recommended f/u views.  Follow up mammogram 12/03/12 - Birads I.

## 2013-07-19 ENCOUNTER — Telehealth: Payer: Self-pay | Admitting: Internal Medicine

## 2013-07-19 ENCOUNTER — Encounter: Payer: Self-pay | Admitting: *Deleted

## 2013-07-19 NOTE — Telephone Encounter (Signed)
Please notify pt that her xray reveals some degenerative disc disease (arthritis) changes more localized in the upper thoracic spine.  The remainder of the xray did not reveal any acute abnormality.  I had placed her on gabapentin (neurontin).  Is this helping?  How much is she taking?  I can titrate the dose if needed.

## 2013-07-19 NOTE — Telephone Encounter (Signed)
Tried to reach patient by phone-unable to leave a message because mailbox was full. Mailed a letter with information on new dosing directions.

## 2013-07-19 NOTE — Telephone Encounter (Signed)
Pt notified of x-ray, she is currently taking the Gabapentin TID as directed, still has to take Aleve at night because she feels that she needs a dose later at night.

## 2013-07-19 NOTE — Telephone Encounter (Signed)
Have her increase her gabapentin to 100mg  on in the am and one in the pm and two q hs.  If tolerates this and needs more then can take on in the am and two in the pm and two q hs.  Tell her to let us know if this works and we can adjust her rx for more pills.  Thanks.

## 2013-07-26 NOTE — Telephone Encounter (Signed)
How much is she taking now and when does she notice the dizziness?  May be able to adjust the way she is taking the medication and decrease her side effects.

## 2013-07-26 NOTE — Telephone Encounter (Signed)
Pt called back in response to letter she received from me. She wanted to let you know that the change in her medication has helped with pain. But she has noticed some dizziness since increasing it.

## 2013-07-27 NOTE — Telephone Encounter (Signed)
I spoke with patient & found out that she misunderstood (I had mailed her a letter also with new directions on 07/19/13 because I could not reach her). I found out this morning that she was taking it incorrectly. She was taking 2 tablets TID. I informed her to take one in the morning, one in the afternoon, & two at bedtime. Pt will make change today & will update on her sx's at her upcoming appt on Wednesday.

## 2013-07-31 ENCOUNTER — Encounter: Payer: Self-pay | Admitting: Internal Medicine

## 2013-07-31 ENCOUNTER — Ambulatory Visit (INDEPENDENT_AMBULATORY_CARE_PROVIDER_SITE_OTHER): Payer: Medicare Other | Admitting: Internal Medicine

## 2013-07-31 VITALS — BP 130/60 | HR 80 | Temp 97.5°F | Resp 18 | Ht 64.25 in | Wt 161.5 lb

## 2013-07-31 DIAGNOSIS — L989 Disorder of the skin and subcutaneous tissue, unspecified: Secondary | ICD-10-CM

## 2013-07-31 DIAGNOSIS — R109 Unspecified abdominal pain: Secondary | ICD-10-CM

## 2013-07-31 DIAGNOSIS — R071 Chest pain on breathing: Secondary | ICD-10-CM

## 2013-07-31 DIAGNOSIS — N289 Disorder of kidney and ureter, unspecified: Secondary | ICD-10-CM

## 2013-07-31 DIAGNOSIS — D649 Anemia, unspecified: Secondary | ICD-10-CM

## 2013-07-31 DIAGNOSIS — I10 Essential (primary) hypertension: Secondary | ICD-10-CM

## 2013-07-31 DIAGNOSIS — R0789 Other chest pain: Secondary | ICD-10-CM

## 2013-07-31 DIAGNOSIS — E119 Type 2 diabetes mellitus without complications: Secondary | ICD-10-CM

## 2013-07-31 MED ORDER — GABAPENTIN 100 MG PO CAPS: ORAL_CAPSULE | ORAL | Status: AC

## 2013-07-31 NOTE — Progress Notes (Signed)
Pre-visit discussion using our clinic review tool. No additional management support is needed unless otherwise documented below in the visit note.  

## 2013-08-04 ENCOUNTER — Encounter: Payer: Self-pay | Admitting: Internal Medicine

## 2013-08-04 DIAGNOSIS — L989 Disorder of the skin and subcutaneous tissue, unspecified: Secondary | ICD-10-CM | POA: Insufficient documentation

## 2013-08-04 NOTE — Assessment & Plan Note (Signed)
Last Cr 1.2.  Follow to confirm stable.  Recent urine microalbumin/cr ratio wnl.

## 2013-08-04 NOTE — Assessment & Plan Note (Signed)
Pain under left breast, left lateral side and posterior back.  No rash.   Question if neuropathic pain.  Recent cxr and thoracic spine xray negative.  Continue neurontin and increase to 100mg  q am and 100mg  q pm and 300mg  q hs.  Will refer to neurology for further evaluation.

## 2013-08-04 NOTE — Assessment & Plan Note (Signed)
Will refer to dermatology.  She is concerned the skin lesions are contributing to her pain.  Appears to be more c/w neuropathic pain.

## 2013-08-04 NOTE — Progress Notes (Signed)
Subjective:    Patient ID: Ana Higgins, female    DOB: June 16, 1926, 80 y.o.   MRN: [id number]  Rash  80 year old female with past history hyperparathyroidism, hypertension, hypercholesterolemia and diabetes who comes in today for a scheduled follow up.  Was seen a couple of weeks ago as a work in with concerns regarding pain and a "rash".   No significant rash found on exam.  She has multiple keratosis.  States these hurt at times.    Pain is better on gabapentin.  Recent xray negative.  Still keeping her awake at night.  No sob.  No pain with deep breathing.  Eating and drinking.  No vomiting.  No nausea.  No injury or trauma.  Sugars elevated.  AM sugars 180-200s and PM sugars averaging 220-280s.     Past Medical History  Diagnosis Date  . Hyperlipidemia   . Hypertension   . Diabetes mellitus without complication   . Osteoporosis   . Hyperparathyroidism   . Carpal tunnel syndrome   . Hx of Raynaud's syndrome     Positive for rheumoid factor, negative anti-CCP antibodies, Nedative FANA    Current Outpatient Prescriptions on File Prior to Visit  Medication Sig Dispense Refill  . amLODipine (NORVASC) 2.5 MG tablet Take 1 tablet (2.5 mg total) by mouth daily.  90 tablet  1  . aspirin EC 81 MG tablet Take 81 mg by mouth daily.      . Calcium Carbonate-Vitamin D (CALCIUM 500 + D PO) Take by mouth daily.      . ferrous fumarate-iron polysaccharide complex (TANDEM) 162-115.2 MG CAPS Take 1 capsule by mouth daily with breakfast.  90 capsule  3  . fish oil-omega-3 fatty acids 1000 MG capsule Take 2 g by mouth daily.      Marland Kitchen. glipiZIDE (GLIPIZIDE XL) 2.5 MG 24 hr tablet Take 1 tablet (2.5 mg total) by mouth every evening. Take with supper  90 tablet  1  . glipiZIDE (GLUCOTROL XL) 5 MG 24 hr tablet Take 1 tablet by mouth in  the morning  90 tablet  1  . hydrochlorothiazide (HYDRODIURIL) 25 MG tablet Take 1 tablet (25 mg total) by mouth daily.  90 tablet  3  . olmesartan (BENICAR) 20 MG tablet Take  1 tablet (20 mg total) by mouth daily.  90 tablet  3  . pantoprazole (PROTONIX) 40 MG tablet Take 1 tablet by mouth  daily  90 tablet  1  . sitaGLIPtin (JANUVIA) 100 MG tablet Take 1 tablet by mouth  daily  90 tablet  1  . vitamin C (ASCORBIC ACID) 500 MG tablet Take 500 mg by mouth daily.       No current facility-administered medications on file prior to visit.    Review of Systems  Skin: Positive for rash.  Patient denies any headache, lightheadedness or dizziness.  No significant sinus or allergy symptoms.   No chest tightness or palpitations.  No increased shortness of breath, cough or congestion.  No nausea or vomiting.  No abdominal pain or cramping.  No bowel change, such as  constipation, BRBPR or melana.  No urine change.  Pain as outlined.  Radiates - left side and under left breast.  Gabapentin has helped some.       Objective:   Physical Exam  Filed Vitals:   07/31/13 0911  BP: 130/60  Pulse: 80  Temp: 97.5 F (36.4 C)  Resp: 6718   80 year old female in no acute  distress.   NECK:  Supple, nontender.  No audible bruit.   HEART:  Appears to be regular. LUNGS:  Without crackles or wheezing audible.  Respirations even and unlabored.  Good breath sounds bilaterally. CHEST WALL:  Increased pain to palpation and light touch left lateral side, flank and under her left breast.  No rash.    RADIAL PULSE:  Equal bilaterally.  ABDOMEN:  Soft, nontender.  No audible abdominal bruit.                   Assessment & Plan:  CARDIOVASCULAR.  Asymptomatic.   HEALTH MAINTENANCE.  Physical 11/06/12.  She is s/p hysterectomy and does not require yearly paps.  Colonoscopy 08/19/10 - internal hemorrhoids and diverticulosis.  Bone density 11/01/11 - osteopenia.  No significant change.  Mammogram 11/21/12 recommended f/u views.  Follow up mammogram 12/03/12 - [facility].

## 2013-08-04 NOTE — Assessment & Plan Note (Signed)
Blood pressure doing well.  Follow metabolic panel.  Same medication.

## 2013-08-04 NOTE — Assessment & Plan Note (Signed)
Colonoscopy as outlined.  EGD 09/08/10 revealed an esophageal stricture and gastritis.  Follow cbc.

## 2013-08-04 NOTE — Assessment & Plan Note (Addendum)
Sugars increased recently with increased pain.  Will go ahead and increase glipizide to 5mg  bid.  Follow sugars.  Get her back in soon to reassess.

## 2013-08-08 ENCOUNTER — Ambulatory Visit: Payer: Medicare Other | Admitting: Internal Medicine

## 2013-08-13 ENCOUNTER — Encounter: Payer: Self-pay | Admitting: Internal Medicine

## 2013-08-15 ENCOUNTER — Encounter: Payer: Self-pay | Admitting: Internal Medicine

## 2013-08-15 ENCOUNTER — Ambulatory Visit (INDEPENDENT_AMBULATORY_CARE_PROVIDER_SITE_OTHER): Payer: Medicare Other | Admitting: Internal Medicine

## 2013-08-15 VITALS — BP 120/70 | HR 87 | Temp 98.5°F | Ht 64.25 in | Wt 160.2 lb

## 2013-08-15 DIAGNOSIS — E119 Type 2 diabetes mellitus without complications: Secondary | ICD-10-CM

## 2013-08-15 DIAGNOSIS — I10 Essential (primary) hypertension: Secondary | ICD-10-CM

## 2013-08-15 DIAGNOSIS — R071 Chest pain on breathing: Secondary | ICD-10-CM

## 2013-08-15 DIAGNOSIS — L989 Disorder of the skin and subcutaneous tissue, unspecified: Secondary | ICD-10-CM

## 2013-08-15 DIAGNOSIS — N644 Mastodynia: Secondary | ICD-10-CM

## 2013-08-15 DIAGNOSIS — R0789 Other chest pain: Secondary | ICD-10-CM

## 2013-08-15 MED ORDER — INSULIN GLARGINE 100 UNIT/ML ~~LOC~~ SOLN: SUBCUTANEOUS | Status: AC

## 2013-08-15 MED ORDER — INSULIN ASPART 100 UNIT/ML ~~LOC~~ SOLN: SUBCUTANEOUS | Status: AC

## 2013-08-15 NOTE — Patient Instructions (Signed)
Take 10 units of Lantus before bed.    Take 4 units of novolog before each meal.    Record your sugar for the next several days.  Call in to us and we will adjust your insulin.

## 2013-08-16 ENCOUNTER — Ambulatory Visit: Payer: Self-pay | Admitting: Internal Medicine

## 2013-08-16 LAB — HM MAMMOGRAPHY: HM MAMMO: ABNORMAL

## 2013-08-18 ENCOUNTER — Encounter: Payer: Self-pay | Admitting: Internal Medicine

## 2013-08-18 NOTE — Assessment & Plan Note (Signed)
For years, sugars have been under good control.  Now out of control.  Significant elevation.  Unclear as to the reason for the change.  Will treat with insulin.  Start lantus 10units q hs and use Novalog with meals.  Titrate up as directed and as needed.  Follow closely.  Call with update over the next week.

## 2013-08-18 NOTE — Assessment & Plan Note (Addendum)
Pain under left breast, left lateral side and posterior back initially.   No rash.   Question if neuropathic pain.  Recent cxr and thoracic spine xray negative.  On neurontin 200mg  tid.  Does help.  Has set her up for neurology evaluation.  Now pain more localized to her left breast.  Check breast mammogram.  Further w/up pending.  No abdominal pain or cramping.  No nausea or vomiting.

## 2013-08-18 NOTE — Progress Notes (Signed)
Subjective:    Patient ID: Ana Higgins, female    DOB: August 21, 1925, 80 y.o.   MRN: [id number]  HPI 80 year old female with past history hyperparathyroidism, hypertension, hypercholesterolemia and diabetes who comes in today as a work in with concern regarding elevated blood sugars.  Trying to stay active.   No cardiac symptoms with increased activity or exertion.  Breathing stable.  Eating and drinking well.  AM sugars now averaging 290-400 and PM sugars - 350-500.  Blood pressure has been doing well.  She is still complaining of increased left side pain.  Now localizes more of the pain to her left breast.  No pain with deep breathing.  No pain with coughing.  Worse when lying down.  She initially attributed the pain to her skin lesions.  Previous visits, more localized to her left lateral side.  Now more localized to her left breast.  No abdominal pain or cramping.  No nausea or vomiting.     Past Medical History  Diagnosis Date  . Hyperlipidemia   . Hypertension   . Diabetes mellitus without complication   . Osteoporosis   . Hyperparathyroidism   . Carpal tunnel syndrome   . Hx of Raynaud's syndrome     Positive for rheumoid factor, negative anti-CCP antibodies, Nedative FANA    Current Outpatient Prescriptions on File Prior to Visit  Medication Sig Dispense Refill  . amLODipine (NORVASC) 2.5 MG tablet Take 1 tablet (2.5 mg total) by mouth daily.  90 tablet  1  . aspirin EC 81 MG tablet Take 81 mg by mouth daily.      . Calcium Carbonate-Vitamin D (CALCIUM 500 + D PO) Take by mouth daily.      . ferrous fumarate-iron polysaccharide complex (TANDEM) 162-115.2 MG CAPS Take 1 capsule by mouth daily with breakfast.  90 capsule  3  . fish oil-omega-3 fatty acids 1000 MG capsule Take 2 g by mouth daily.      Marland Kitchen gabapentin (NEURONTIN) 100 MG capsule Take on tablet in the am and one tablet in the afternoon and three tablets at bedtime  150 capsule  1  . glipiZIDE (GLIPIZIDE XL) 2.5 MG 24 hr  tablet Take 1 tablet (2.5 mg total) by mouth every evening. Take with supper  90 tablet  1  . glipiZIDE (GLUCOTROL XL) 5 MG 24 hr tablet Take 1 tablet by mouth in  the morning  90 tablet  1  . hydrochlorothiazide (HYDRODIURIL) 25 MG tablet Take 1 tablet (25 mg total) by mouth daily.  90 tablet  3  . olmesartan (BENICAR) 20 MG tablet Take 1 tablet (20 mg total) by mouth daily.  90 tablet  3  . pantoprazole (PROTONIX) 40 MG tablet Take 1 tablet by mouth  daily  90 tablet  1  . sitaGLIPtin (JANUVIA) 100 MG tablet Take 1 tablet by mouth  daily  90 tablet  1  . vitamin C (ASCORBIC ACID) 500 MG tablet Take 500 mg by mouth daily.       No current facility-administered medications on file prior to visit.    Review of Systems Patient denies any headache, lightheadedness or dizziness.  No significant sinus or allergy symptoms.   No chest pain, tightness or palpitations.  No increased shortness of breath, cough or congestion.  No nausea or vomiting.  No abdominal pain or cramping.  No bowel change, such as  constipation, BRBPR or melana.  No urine change.  Persistent left side pain as outlined.  Sugars as outlined.       Objective:   Physical Exam  Filed Vitals:   08/15/13 1420  BP: 120/70  Pulse: 87  Temp: 98.5 F (5936.469 C)   80 year old female in no acute distress.   HEENT:  Nares - clear.  OP- without lesions or erythema.  NECK:  Supple, nontender.  No audible bruit.   HEART:  Appears to be regular. LUNGS:  Without crackles or wheezing audible.  Respirations even and unlabored.   Good breath sounds bilaterally.  No pain with deep inspiration.   BREASTS:  Increased pain left lateral breast.  Some increased fullness - left breast - 3-5:00 region.  No nipple discharge.   RADIAL PULSE:  Equal bilaterally.  ABDOMEN:  Soft, nontender.  No audible abdominal bruit.   EXTREMITIES:  No increased edema to be present.  Feet without lesions.                  Assessment & Plan:  CARDIOVASCULAR.   Asymptomatic.   HEALTH MAINTENANCE.  Physical 11/06/12.  She is s/p hysterectomy and does not require yearly paps.  Colonoscopy 08/19/10 - internal hemorrhoids and diverticulosis.  Bone density 11/01/11 - osteopenia.  No significant change.  Mammogram 11/21/12 recommended f/u views.  Follow up mammogram 12/03/12 - [facility].

## 2013-08-18 NOTE — Assessment & Plan Note (Signed)
Blood pressure doing well.  Follow metabolic panel.  Same medication.

## 2013-08-18 NOTE — Assessment & Plan Note (Signed)
Will refer to dermatology.  She is concerned the skin lesions are contributing to her pain.  Treat as outlined.

## 2013-08-19 ENCOUNTER — Telehealth: Payer: Self-pay | Admitting: Internal Medicine

## 2013-08-19 ENCOUNTER — Encounter: Payer: Self-pay | Admitting: Internal Medicine

## 2013-08-19 ENCOUNTER — Telehealth: Payer: Self-pay | Admitting: Emergency Medicine

## 2013-08-19 NOTE — Telephone Encounter (Signed)
Report requested

## 2013-08-19 NOTE — Telephone Encounter (Signed)
Ana [facility]manda at [facility] called to make us aware that the patient had her mammogram and was given at [facility], they have rec pt have a core biopsy. Would [facility] to [facility] surgeon?

## 2013-08-19 NOTE — Telephone Encounter (Signed)
Pt calling to report blood sugars:  Before each meal and bedtimes  1/30  Before breakfast 324, before lunch 503, evening 499  1/31 Before breakfast 367, 1:00 p.m. 477, bedtime 437  2/1 Before breakfast 7 a.m. 326, 12:28 p.,. 448, 5:58 p.m. 415,  9 p.m. 318  2/2  301 before breakfast, just took before midday meal 1:06 p.m. 522.

## 2013-08-19 NOTE — Telephone Encounter (Signed)
Called pt and spoke to her regarding her sugars.  She is currently taking Lantus 10 units before bed.  She is taking 4 units of novalog before each meal.  Will have her titrate her lantus 2 units every five days (if fasting sugar is >200).  She will also titrate her novolog 1 unit for every 50 points above 200.  (base at 4 units before her meals).  Send in readings in the next several days.  Pt expressed understanding.  No nausea or vomiting.  Still with the side pain.

## 2013-08-19 NOTE — Telephone Encounter (Signed)
I want her to see a surgeon and I need report from hospital to review.  I will talk with pt once I receive the report.

## 2013-08-20 ENCOUNTER — Encounter: Payer: Self-pay | Admitting: Internal Medicine

## 2013-08-21 ENCOUNTER — Emergency Department: Payer: Self-pay | Admitting: Emergency Medicine

## 2013-08-21 LAB — COMPREHENSIVE METABOLIC PANEL
ANION GAP: 8 (ref 7–16)
AST: 31 U/L (ref 15–37)
Albumin: 2.9 g/dL — ABNORMAL LOW (ref 3.4–5.0)
Alkaline Phosphatase: 105 U/L
BUN: 41 mg/dL — ABNORMAL HIGH (ref 7–18)
Bilirubin,Total: 0.2 mg/dL (ref 0.2–1.0)
CO2: 30 mmol/L (ref 21–32)
Calcium, Total: 8.9 mg/dL (ref 8.5–10.1)
Chloride: 93 mmol/L — ABNORMAL LOW (ref 98–107)
Creatinine: 1.93 mg/dL — ABNORMAL HIGH (ref 0.60–1.30)
EGFR (African American): 27 — ABNORMAL LOW
GFR CALC NON AF AMER: 23 — AB
Glucose: 273 mg/dL — ABNORMAL HIGH (ref 65–99)
OSMOLALITY: 282 (ref 275–301)
Potassium: 4.9 mmol/L (ref 3.5–5.1)
SGPT (ALT): 36 U/L (ref 12–78)
SODIUM: 131 mmol/L — AB (ref 136–145)
Total Protein: 6.8 g/dL (ref 6.4–8.2)

## 2013-08-21 LAB — CBC
HCT: 22.8 % — AB (ref 35.0–47.0)
HGB: 7.6 g/dL — ABNORMAL LOW (ref 12.0–16.0)
MCH: 27.3 pg (ref 26.0–34.0)
MCHC: 33.2 g/dL (ref 32.0–36.0)
MCV: 82 fL (ref 80–100)
Platelet: 391 10*3/uL (ref 150–440)
RBC: 2.77 10*6/uL — ABNORMAL LOW (ref 3.80–5.20)
RDW: 13.4 % (ref 11.5–14.5)
WBC: 10.4 10*3/uL (ref 3.6–11.0)

## 2013-08-21 LAB — URINALYSIS, COMPLETE
Bilirubin,UR: NEGATIVE
GLUCOSE, UR: NEGATIVE mg/dL (ref 0–75)
Ketone: NEGATIVE
Leukocyte Esterase: NEGATIVE
Nitrite: NEGATIVE
PH: 5 (ref 4.5–8.0)
Protein: 30
Specific Gravity: 1.016 (ref 1.003–1.030)
Squamous Epithelial: 2
WBC UR: 4 /HPF (ref 0–5)

## 2013-08-22 ENCOUNTER — Telehealth: Payer: Self-pay | Admitting: *Deleted

## 2013-08-22 ENCOUNTER — Inpatient Hospital Stay: Payer: Self-pay | Admitting: Internal Medicine

## 2013-08-22 LAB — URINALYSIS, COMPLETE
BILIRUBIN, UR: NEGATIVE
Glucose,UR: NEGATIVE mg/dL (ref 0–75)
KETONE: NEGATIVE
Leukocyte Esterase: NEGATIVE
NITRITE: NEGATIVE
PH: 5 (ref 4.5–8.0)
PROTEIN: NEGATIVE
RBC,UR: 130 /HPF (ref 0–5)
Specific Gravity: 1.009 (ref 1.003–1.030)

## 2013-08-22 LAB — BASIC METABOLIC PANEL
Anion Gap: 7 (ref 7–16)
BUN: 40 mg/dL — AB (ref 7–18)
CALCIUM: 8.3 mg/dL — AB (ref 8.5–10.1)
CHLORIDE: 94 mmol/L — AB (ref 98–107)
Co2: 28 mmol/L (ref 21–32)
Creatinine: 1.55 mg/dL — ABNORMAL HIGH (ref 0.60–1.30)
EGFR (African American): 35 — ABNORMAL LOW
EGFR (Non-African Amer.): 30 — ABNORMAL LOW
Glucose: 349 mg/dL — ABNORMAL HIGH (ref 65–99)
OSMOLALITY: 283 (ref 275–301)
Potassium: 4.5 mmol/L (ref 3.5–5.1)
Sodium: 129 mmol/L — ABNORMAL LOW (ref 136–145)

## 2013-08-22 LAB — PROTIME-INR
INR: 1
Prothrombin Time: 12.8 secs (ref 11.5–14.7)

## 2013-08-22 LAB — CBC
HCT: 21.1 % — ABNORMAL LOW (ref 35.0–47.0)
HGB: 6.9 g/dL — ABNORMAL LOW (ref 12.0–16.0)
MCH: 27 pg (ref 26.0–34.0)
MCHC: 32.8 g/dL (ref 32.0–36.0)
MCV: 82 fL (ref 80–100)
Platelet: 365 10*3/uL (ref 150–440)
RBC: 2.56 10*6/uL — AB (ref 3.80–5.20)
RDW: 13 % (ref 11.5–14.5)
WBC: 9.3 10*3/uL (ref 3.6–11.0)

## 2013-08-22 LAB — RETICULOCYTES
ABSOLUTE RETIC COUNT: 0.0854 10*6/uL (ref 0.019–0.186)
RETICULOCYTE: 3.32 % — AB (ref 0.4–3.1)

## 2013-08-22 LAB — IRON AND TIBC
IRON SATURATION: 10 %
Iron Bind.Cap.(Total): 195 ug/dL — ABNORMAL LOW (ref 250–450)
Iron: 20 ug/dL — ABNORMAL LOW (ref 50–170)
Unbound Iron-Bind.Cap.: 175 ug/dL

## 2013-08-22 NOTE — Telephone Encounter (Signed)
Patient Information:  Caller Name: Fulton Molelice  Phone: [phone]  Patient: Ana Higgins, Ana Higgins  Gender: Female  DOB: May 02, 1926  Age: 80 Years  PCP: Dale DurhamScott, Charlene  Office Follow Up:  Does the office need to follow up with this patient?: No  Instructions For The Office: N/A  RN Note:  Pt. niece has arrived to take her to the Urgent Care. Does not want to go to the ED as recommended. [facility] so she will not have to wait. Will notify her Dr. of this information.  Symptoms  Reason For Call & Symptoms: Calling in. Blood Sugar 578 at 12:08 on 08/22/13. Went to the ED last night when it went over 400. They made her sit there for 4 hours.  Reviewed Health History In EMR: Yes  Reviewed Medications In EMR: Yes  Reviewed Allergies In EMR: Yes  Reviewed Surgeries / Procedures: Yes  Date of Onset of Symptoms: 08/21/2013  Treatments Tried: Novalog 10 units  Treatments Tried Worked: Yes  Guideline(s) Used:  Diabetes - High Blood Sugar  Disposition Per Guideline:   Go to ED Now (or to Office with PCP Approval)  Reason For Disposition Reached:   Blood glucose > 500 mg/dl (29.527.5 mmol/Higgins)  Advice Given:  Call Back If:  Blood glucose more than 300 mg/dL (62.116.5 mmol/Higgins), 2 or more times in a row.  Rapid breathing occurs  You become worse.  Patient Will Follow Care Advice:  YES

## 2013-08-22 NOTE — Telephone Encounter (Signed)
FYI

## 2013-08-22 NOTE — Telephone Encounter (Signed)
Agree with ER evaluation.  Have been trying to adjust her insulin and unable to get sugars under control  Will need evaluation and fluids (possible insulin drip).

## 2013-08-22 NOTE — Telephone Encounter (Signed)
I Spoke with patient & advised her to go to the ER since her Blood Sugars have been running over 400 & even up to 578. Pt verbalized understanding & states that she will go to the ER now, but does not want to wait all day to be seen.

## 2013-08-22 NOTE — Telephone Encounter (Signed)
Message was already taken care of [facility]see [facility] below

## 2013-08-23 ENCOUNTER — Telehealth: Payer: Self-pay | Admitting: Internal Medicine

## 2013-08-23 DIAGNOSIS — R928 Other abnormal and inconclusive findings on diagnostic imaging of breast: Secondary | ICD-10-CM

## 2013-08-23 LAB — CBC WITH DIFFERENTIAL/PLATELET
BASOS PCT: 0.3 %
Basophil #: 0 10*3/uL (ref 0.0–0.1)
EOS ABS: 0.2 10*3/uL (ref 0.0–0.7)
Eosinophil %: 2.5 %
HCT: 23.2 % — AB (ref 35.0–47.0)
HGB: 8.1 g/dL — ABNORMAL LOW (ref 12.0–16.0)
LYMPHS ABS: 1.2 10*3/uL (ref 1.0–3.6)
Lymphocyte %: 12.2 %
MCH: 29.1 pg (ref 26.0–34.0)
MCHC: 34.9 g/dL (ref 32.0–36.0)
MCV: 83 fL (ref 80–100)
Monocyte #: 0.9 x10 3/mm (ref 0.2–0.9)
Monocyte %: 9.7 %
NEUTROS ABS: 7.2 10*3/uL — AB (ref 1.4–6.5)
Neutrophil %: 75.3 %
Platelet: 356 10*3/uL (ref 150–440)
RBC: 2.79 10*6/uL — ABNORMAL LOW (ref 3.80–5.20)
RDW: 13.7 % (ref 11.5–14.5)
WBC: 9.6 10*3/uL (ref 3.6–11.0)

## 2013-08-23 LAB — BASIC METABOLIC PANEL
Anion Gap: 2 — ABNORMAL LOW (ref 7–16)
BUN: 32 mg/dL — ABNORMAL HIGH (ref 7–18)
CO2: 29 mmol/L (ref 21–32)
Calcium, Total: 8.3 mg/dL — ABNORMAL LOW (ref 8.5–10.1)
Chloride: 101 mmol/L (ref 98–107)
Creatinine: 1.29 mg/dL (ref 0.60–1.30)
EGFR (African American): 44 — ABNORMAL LOW
EGFR (Non-African Amer.): 38 — ABNORMAL LOW
Glucose: 193 mg/dL — ABNORMAL HIGH (ref 65–99)
OSMOLALITY: 277 (ref 275–301)
POTASSIUM: 4.3 mmol/L (ref 3.5–5.1)
Sodium: 132 mmol/L — ABNORMAL LOW (ref 136–145)

## 2013-08-23 LAB — HEMOGLOBIN: HGB: 7.8 g/dL — AB (ref 12.0–16.0)

## 2013-08-23 NOTE — Telephone Encounter (Signed)
Order place for surgery referral for abnormal mammo

## 2013-08-24 LAB — CBC WITH DIFFERENTIAL/PLATELET
Basophil #: 0.1 10*3/uL (ref 0.0–0.1)
Basophil %: 0.7 %
EOS ABS: 0.3 10*3/uL (ref 0.0–0.7)
Eosinophil %: 2.4 %
HCT: 22.3 % — ABNORMAL LOW (ref 35.0–47.0)
HGB: 7.7 g/dL — ABNORMAL LOW (ref 12.0–16.0)
Lymphocyte #: 1.3 10*3/uL (ref 1.0–3.6)
Lymphocyte %: 11.7 %
MCH: 28.6 pg (ref 26.0–34.0)
MCHC: 34.3 g/dL (ref 32.0–36.0)
MCV: 83 fL (ref 80–100)
Monocyte #: 1.1 x10 3/mm — ABNORMAL HIGH (ref 0.2–0.9)
Monocyte %: 9.4 %
NEUTROS PCT: 75.8 %
Neutrophil #: 8.5 10*3/uL — ABNORMAL HIGH (ref 1.4–6.5)
Platelet: 367 10*3/uL (ref 150–440)
RBC: 2.68 10*6/uL — AB (ref 3.80–5.20)
RDW: 13.7 % (ref 11.5–14.5)
WBC: 11.2 10*3/uL — ABNORMAL HIGH (ref 3.6–11.0)

## 2013-08-24 LAB — HEMOGLOBIN: HGB: 8 g/dL — ABNORMAL LOW (ref 12.0–16.0)

## 2013-08-25 LAB — HEMOGLOBIN: HGB: 8 g/dL — ABNORMAL LOW (ref 12.0–16.0)

## 2013-08-26 ENCOUNTER — Telehealth: Payer: Self-pay | Admitting: *Deleted

## 2013-08-26 LAB — HEMOGLOBIN: HGB: 7.5 g/dL — AB (ref 12.0–16.0)

## 2013-08-26 NOTE — Telephone Encounter (Signed)
Refill Request  BD ins syr u-f II 1mL [id number]   Use to inject insulin up to 4 times daily

## 2013-08-27 LAB — HEMOGLOBIN: HGB: 7.9 g/dL — AB (ref 12.0–16.0)

## 2013-08-28 ENCOUNTER — Ambulatory Visit: Payer: Self-pay | Admitting: Gastroenterology

## 2013-08-28 LAB — CBC WITH DIFFERENTIAL/PLATELET
Basophil #: 0 10*3/uL (ref 0.0–0.1)
Basophil %: 0.3 %
Eosinophil #: 0.2 10*3/uL (ref 0.0–0.7)
Eosinophil %: 2.4 %
HCT: 23.9 % — AB (ref 35.0–47.0)
HGB: 7.9 g/dL — AB (ref 12.0–16.0)
Lymphocyte #: 1 10*3/uL (ref 1.0–3.6)
Lymphocyte %: 9.7 %
MCH: 27.9 pg (ref 26.0–34.0)
MCHC: 32.8 g/dL (ref 32.0–36.0)
MCV: 85 fL (ref 80–100)
Monocyte #: 1.1 x10 3/mm — ABNORMAL HIGH (ref 0.2–0.9)
Monocyte %: 10.2 %
Neutrophil #: 8 10*3/uL — ABNORMAL HIGH (ref 1.4–6.5)
Neutrophil %: 77.4 %
Platelet: 345 10*3/uL (ref 150–440)
RBC: 2.81 10*6/uL — ABNORMAL LOW (ref 3.80–5.20)
RDW: 14.7 % — ABNORMAL HIGH (ref 11.5–14.5)
WBC: 10.3 10*3/uL (ref 3.6–11.0)

## 2013-08-28 NOTE — Telephone Encounter (Signed)
Phoned Rx to pharmacy.

## 2013-08-30 ENCOUNTER — Telehealth: Payer: Self-pay | Admitting: Internal Medicine

## 2013-08-30 LAB — PATHOLOGY REPORT

## 2013-08-30 NOTE — Telephone Encounter (Signed)
Attempted to call pt and left message.  CBB/CAN

## 2013-08-30 NOTE — Telephone Encounter (Signed)
Pt notified & ER records requested

## 2013-08-30 NOTE — Telephone Encounter (Signed)
Duplicate see attached - see her 09/04/13 at 1:00.  See previous message regarding eval

## 2013-08-30 NOTE — Telephone Encounter (Signed)
Currently scheduled to see Raquel on 09/09/13 (ER records requested)

## 2013-08-30 NOTE — Telephone Encounter (Signed)
Was treated at the hospital for dehydration. Now reports that she has no energy & ankles/legs are swollen. Wants to know if she can be seen sooner.

## 2013-08-30 NOTE — Telephone Encounter (Signed)
I can see her Wednesday 09/04/13.  If acute or worsening sx, needs eval earlier (ER or Acute care).   Need hospital discharge summary.

## 2013-09-04 ENCOUNTER — Inpatient Hospital Stay: Payer: Self-pay | Admitting: Internal Medicine

## 2013-09-04 ENCOUNTER — Ambulatory Visit: Payer: Medicare Other | Admitting: Internal Medicine

## 2013-09-04 LAB — BASIC METABOLIC PANEL
ANION GAP: 8 (ref 7–16)
BUN: 57 mg/dL — ABNORMAL HIGH (ref 7–18)
CALCIUM: 8.8 mg/dL (ref 8.5–10.1)
CHLORIDE: 101 mmol/L (ref 98–107)
Co2: 23 mmol/L (ref 21–32)
Creatinine: 2.59 mg/dL — ABNORMAL HIGH (ref 0.60–1.30)
GFR CALC AF AMER: 19 — AB
GFR CALC NON AF AMER: 16 — AB
Glucose: 176 mg/dL — ABNORMAL HIGH (ref 65–99)
Osmolality: 285 (ref 275–301)
Potassium: 5.8 mmol/L — ABNORMAL HIGH (ref 3.5–5.1)
Sodium: 132 mmol/L — ABNORMAL LOW (ref 136–145)

## 2013-09-04 LAB — CBC WITH DIFFERENTIAL/PLATELET
BASOS PCT: 0.4 %
Basophil #: 0.1 10*3/uL (ref 0.0–0.1)
EOS ABS: 0.5 10*3/uL (ref 0.0–0.7)
Eosinophil %: 3.1 %
HCT: 22.8 % — ABNORMAL LOW (ref 35.0–47.0)
HGB: 7.5 g/dL — ABNORMAL LOW (ref 12.0–16.0)
LYMPHS ABS: 1.2 10*3/uL (ref 1.0–3.6)
LYMPHS PCT: 7.9 %
MCH: 27.7 pg (ref 26.0–34.0)
MCHC: 32.7 g/dL (ref 32.0–36.0)
MCV: 85 fL (ref 80–100)
Monocyte #: 0.9 x10 3/mm (ref 0.2–0.9)
Monocyte %: 6.1 %
NEUTROS ABS: 12.4 10*3/uL — AB (ref 1.4–6.5)
Neutrophil %: 82.5 %
PLATELETS: 310 10*3/uL (ref 150–440)
RBC: 2.7 10*6/uL — ABNORMAL LOW (ref 3.80–5.20)
RDW: 15.9 % — AB (ref 11.5–14.5)
WBC: 15 10*3/uL — ABNORMAL HIGH (ref 3.6–11.0)

## 2013-09-04 LAB — DIFFERENTIAL
Basophil #: 0 10*3/uL (ref 0.0–0.1)
Basophil %: 0.3 %
EOS PCT: 2.6 %
Eosinophil #: 0.4 10*3/uL (ref 0.0–0.7)
LYMPHS PCT: 9.3 %
Lymphocyte #: 1.4 10*3/uL (ref 1.0–3.6)
MONO ABS: 1.2 x10 3/mm — AB (ref 0.2–0.9)
Monocyte %: 8.3 %
NEUTROS PCT: 79.5 %
Neutrophil #: 11.8 10*3/uL — ABNORMAL HIGH (ref 1.4–6.5)

## 2013-09-04 LAB — TROPONIN I
Troponin-I: <0.02 ng/mL
Troponin-I: <0.02 ng/mL

## 2013-09-04 LAB — COMPREHENSIVE METABOLIC PANEL
ALT: 38 U/L (ref 12–78)
ANION GAP: 7 (ref 7–16)
Albumin: 2 g/dL — ABNORMAL LOW (ref 3.4–5.0)
Alkaline Phosphatase: 240 U/L — ABNORMAL HIGH
BILIRUBIN TOTAL: 0.4 mg/dL (ref 0.2–1.0)
BUN: 61 mg/dL — ABNORMAL HIGH (ref 7–18)
CHLORIDE: 101 mmol/L (ref 98–107)
Calcium, Total: 9.3 mg/dL (ref 8.5–10.1)
Co2: 26 mmol/L (ref 21–32)
Creatinine: 2.85 mg/dL — ABNORMAL HIGH (ref 0.60–1.30)
GFR CALC AF AMER: 17 — AB
GFR CALC NON AF AMER: 14 — AB
GLUCOSE: 215 mg/dL — AB (ref 65–99)
Osmolality: 292 (ref 275–301)
Potassium: 5.6 mmol/L — ABNORMAL HIGH (ref 3.5–5.1)
SGOT(AST): 62 U/L — ABNORMAL HIGH (ref 15–37)
Sodium: 134 mmol/L — ABNORMAL LOW (ref 136–145)
Total Protein: 6 g/dL — ABNORMAL LOW (ref 6.4–8.2)

## 2013-09-04 LAB — CBC
HCT: 23.8 % — AB (ref 35.0–47.0)
HGB: 7.6 g/dL — ABNORMAL LOW (ref 12.0–16.0)
MCH: 26.8 pg (ref 26.0–34.0)
MCHC: 31.7 g/dL — AB (ref 32.0–36.0)
MCV: 85 fL (ref 80–100)
PLATELETS: 345 10*3/uL (ref 150–440)
RBC: 2.81 10*6/uL — ABNORMAL LOW (ref 3.80–5.20)
RDW: 15.9 % — AB (ref 11.5–14.5)
WBC: 14.7 10*3/uL — AB (ref 3.6–11.0)

## 2013-09-04 LAB — CK: CK, Total: 35 U/L

## 2013-09-04 LAB — PRO B NATRIURETIC PEPTIDE: B-Type Natriuretic Peptide: 3702 pg/mL — ABNORMAL HIGH (ref 0–450)

## 2013-09-04 LAB — APTT: ACTIVATED PTT: 36.3 s — AB (ref 23.6–35.9)

## 2013-09-05 ENCOUNTER — Ambulatory Visit: Payer: Self-pay | Admitting: Internal Medicine

## 2013-09-05 DIAGNOSIS — I059 Rheumatic mitral valve disease, unspecified: Secondary | ICD-10-CM

## 2013-09-05 LAB — BASIC METABOLIC PANEL
ANION GAP: 6 — AB (ref 7–16)
BUN: 60 mg/dL — AB (ref 7–18)
Calcium, Total: 9.2 mg/dL (ref 8.5–10.1)
Chloride: 101 mmol/L (ref 98–107)
Co2: 26 mmol/L (ref 21–32)
Creatinine: 2.8 mg/dL — ABNORMAL HIGH (ref 0.60–1.30)
EGFR (African American): 17 — ABNORMAL LOW
GFR CALC NON AF AMER: 15 — AB
GLUCOSE: 77 mg/dL (ref 65–99)
Osmolality: 282 (ref 275–301)
Potassium: 5.4 mmol/L — ABNORMAL HIGH (ref 3.5–5.1)
Sodium: 133 mmol/L — ABNORMAL LOW (ref 136–145)

## 2013-09-05 LAB — CBC WITH DIFFERENTIAL/PLATELET
BASOS ABS: 0 10*3/uL (ref 0.0–0.1)
Basophil %: 0.3 %
EOS PCT: 3.4 %
Eosinophil #: 0.5 10*3/uL (ref 0.0–0.7)
HCT: 21.6 % — AB (ref 35.0–47.0)
HGB: 7.4 g/dL — AB (ref 12.0–16.0)
LYMPHS ABS: 1.4 10*3/uL (ref 1.0–3.6)
Lymphocyte %: 9.2 %
MCH: 29 pg (ref 26.0–34.0)
MCHC: 34.2 g/dL (ref 32.0–36.0)
MCV: 85 fL (ref 80–100)
MONOS PCT: 7.5 %
Monocyte #: 1.2 x10 3/mm — ABNORMAL HIGH (ref 0.2–0.9)
Neutrophil #: 12.3 10*3/uL — ABNORMAL HIGH (ref 1.4–6.5)
Neutrophil %: 79.6 %
PLATELETS: 315 10*3/uL (ref 150–440)
RBC: 2.56 10*6/uL — ABNORMAL LOW (ref 3.80–5.20)
RDW: 15.4 % — ABNORMAL HIGH (ref 11.5–14.5)
WBC: 15.5 10*3/uL — AB (ref 3.6–11.0)

## 2013-09-05 LAB — APTT: ACTIVATED PTT: 47.5 s — AB (ref 23.6–35.9)

## 2013-09-05 LAB — URINALYSIS, COMPLETE
BILIRUBIN, UR: NEGATIVE
Glucose,UR: NEGATIVE mg/dL (ref 0–75)
Hyaline Cast: 9
KETONE: NEGATIVE
LEUKOCYTE ESTERASE: NEGATIVE
Nitrite: NEGATIVE
PH: 5 (ref 4.5–8.0)
Protein: NEGATIVE
RBC,UR: 143 /HPF (ref 0–5)
Specific Gravity: 1.01 (ref 1.003–1.030)
Squamous Epithelial: NONE SEEN

## 2013-09-06 ENCOUNTER — Telehealth: Payer: Self-pay | Admitting: *Deleted

## 2013-09-06 LAB — CANCER ANTIGEN 19-9: CA 19-9: 129 U/mL — ABNORMAL HIGH (ref 0–35)

## 2013-09-06 LAB — URINE CULTURE

## 2013-09-06 NOTE — Telephone Encounter (Signed)
[facility]

## 2013-09-09 ENCOUNTER — Ambulatory Visit: Payer: Medicare Other | Admitting: Adult Health

## 2013-09-09 ENCOUNTER — Telehealth: Payer: Self-pay | Admitting: *Deleted

## 2013-09-09 NOTE — Telephone Encounter (Signed)
Ana Higgins (2025/08/02[facility].

## 2013-09-09 NOTE — Telephone Encounter (Signed)
Noted  

## 2013-09-11 ENCOUNTER — Ambulatory Visit: Payer: Medicare Other | Admitting: Internal Medicine

## 2013-09-15 ENCOUNTER — Ambulatory Visit: Payer: Self-pay | Admitting: Internal Medicine

## 2013-09-15 DEATH — deceased

## 2013-09-18 ENCOUNTER — Encounter: Payer: Self-pay | Admitting: Internal Medicine

## 2013-10-11 ENCOUNTER — Encounter: Payer: Self-pay | Admitting: Internal Medicine

## 2013-10-14 ENCOUNTER — Telehealth: Payer: Self-pay | Admitting: *Deleted

## 2013-10-14 NOTE — Telephone Encounter (Signed)
Refill Request  Breeze 2 disc test strip

## 2013-10-14 NOTE — Telephone Encounter (Signed)
Last telephone note states pt deceased 06/29/14.

## 2014-11-08 NOTE — Discharge Summary (Signed)
PATIENT NAME:  Ana Higgins, Ana Higgins MR#:  [id number] DATE OF BIRTH:  April 13, 1926  DATE OF ADMISSION:  09/04/2013 DATE OF DISCHARGE:  09/06/2013  For a detailed note, please take a look at the history and physical done on admission by Dr. Winona Legato.   DIAGNOSES UPON DISCHARGE:  As follows: 1.  A pancreatic mass with metastasis to the liver suspected to be pancreatic cancer. 2.  Shortness of breath. 3.  Pleural effusions and ascites secondary to underlying malignancy. 4.  Acute on chronic renal failure. 5.  Chronic anemia.  6.  Diabetes.   CONSULTANTS DURING HOSPITAL COURSE:  Dr. Wendie Simmer from oncology; Dr. Mosetta Pigeon, nephrology; Dr. Harriett Sine Phifer, palliative care; Dr. Barnetta Chapel from gastroenterology.   PERTINENT STUDIES DONE DURING THE HOSPITAL COURSE:  Are as follows: A chest x-ray done on admission showing new increased density in the left lung base consistent with atelectasis or pneumonia, small left pleural effusion and trace right pleural effusion. An ultrasound of the lower extremities showing no evidence of any DVT. An ultrasound of the abdomen showing pancreatic mass with common bile duct and pancreatic duct obstruction, likely adenocarcinoma, also numerous hepatic metastases. Gallbladder sludge, ascites with bilateral pleural effusion. A nuclear medicine lung V/Q scan done showing low probability for pulmonary embolism. An echocardiogram done showing ejection fraction of 60% to 65%. Pleural effusion noted on the left, normal global LV systolic function, normal right ventricle size and systolic function, mildly elevated pulmonary artery systolic pressures.   HOSPITAL COURSE: This is an 81 year old female who presented to the hospital on 09/04/2013 secondary to shortness of breath and noted to be in acute respiratory failure.   PROBLEM: 1.  Acute respiratory failure. The likely source of patient's shortness of breath was pleural effusions and volume overload. Initially, the thought process  was that she may have had a pulmonary embolism as her D-dimer was over 6, although she presented with renal failure and therefore they could not obtain a CT scan. She underwent a V/Q scan that was low probability. She had Dopplers of her lower extremities, which are negative for deep vein thrombosis. She was empirically started on heparin, but that was discontinued as the likely cause of her shortness of breath was likely pleural effusions and volume overload. She was given some albumin and Lasix and her symptoms had slightly improved with that.  2.  Acute renal failure. This was likely secondary to the acute tubular necrosis from poor p.o. intake and also low oncotic pressure from her severe hypoalbuminemia and possible underlying malignancy. The patient was diuresed with IV Lasix along with albumin infusions and her creatinine although remained fairly elevated and unstable. Abdominal ultrasound, although did not show any evidence of hydronephrosis.  3.  Anemia. This is likely anemia of chronic disease. The patient's hemoglobin remained stable. She did not require transfusion. 4.  Diabetes. The patient was maintained on her Levemir and sliding scale insulin.  5.  Pancreatic mass with hepatic metastasis. This was likely the cause of the patient's overall weakness and shortness of breath. She likely has underlying pancreatic malignancy, which has been undiagnosed. CA-19-9 was significantly elevated at 125. Oncology consult was obtained. The patient was seen by Dr. Wendie Simmer and after discussion with the patient's family they did not want to opt for any biopsy or further investigations. The patient was seen by palliative care and they opted for more comfort care and, therefore, the patient was discharged to hospice home. The patient was therefore discharged to the hospice facility  for further care given her poor prognosis and the likely metastatic pancreatic cancer.   DISCHARGE MEDICATIONS:  Gabapentin 400 mg  t.i.d., fentanyl patch 25 mcg q. 72 hours, lorazepam 0.5 mg 1 to 2 tabs sublingual q. 2 to 4 hours as needed and morphine sublingual 0.25 mL q. 1 to 2 hours as needed.  The patient is a DNI/DNR.  TIME SPENT: 40 minutes   ____________________________ Rolly PancakeVivek J. Cherlynn KaiserSainani, MD vjs:ce D: 09/06/2013 16:00:22 ET T: 09/06/2013 17:01:28 ET JOB#: [id number]  cc: Rolly PancakeVivek J. Cherlynn KaiserSainani, MD, <Dictator> Houston SirenVIVEK J Olayinka Gathers MD ELECTRONICALLY SIGNED 09/13/2013 18:05

## 2014-11-08 NOTE — Consult Note (Signed)
Chief Complaint:  Subjective/Chief Complaint seen for heme positive stool and anemia.  no n/v or abd pain.. c/o constipation and pain associated with previous shingles site on left flank.   no bm since admission.   VITAL SIGNS/ANCILLARY NOTES: **Vital Signs.:   07-Feb-15 04:44  Vital Signs Type Routine  Temperature Temperature (F) 99.2  Celsius 37.3  Temperature Source oral  Pulse Pulse 95  Respirations Respirations 18  Systolic BP Systolic BP 148  Diastolic BP (mmHg) Diastolic BP (mmHg) 75  Mean BP 99  Pulse Ox % Pulse Ox % 92  Pulse Ox Activity Level  At rest  Oxygen Delivery Room Air/ 21 %   Brief Assessment:  Cardiac Regular   Respiratory clear BS   Gastrointestinal details normal Soft  Nontender  Nondistended  Bowel sounds normal   Lab Results: Routine BB:  05-Feb-15 17:43   Crossmatch Unit 3 Transfused  Result(s) reported on 23 Aug 2013 at 07:23AM.  Routine Hem:  05-Feb-15 13:51   Hemoglobin (CBC)  6.9  06-Feb-15 06:33   Hemoglobin (CBC)  8.1    21:54   Hemoglobin (CBC)  7.8 (Result(s) reported on 23 Aug 2013 at 10:43PM.)  07-Feb-15 04:40   WBC (CBC)  11.2  RBC (CBC)  2.68  Hemoglobin (CBC)  7.7  Hematocrit (CBC)  22.3  Platelet Count (CBC) 367  MCV 83  MCH 28.6  MCHC 34.3  RDW 13.7  Neutrophil % 75.8  Lymphocyte % 11.7  Monocyte % 9.4  Eosinophil % 2.4  Basophil % 0.7  Neutrophil #  8.5  Lymphocyte # 1.3  Monocyte #  1.1  Eosinophil # 0.3  Basophil # 0.1 (Result(s) reported on 24 Aug 2013 at 05:37AM.)   Assessment/Plan:  Assessment/Plan:  Assessment 1) anemia and heme positive stool.  hemodynamically stable, no evidence of ongoing gi bleeding.   2) constipation 3) post herpetic pain   Plan 1) egd monday.  I will see if can be done sooner (tomorrow). I have discussed the risks benefits and complicatiosn of egd to include not limited to bleeding infection perforation and sedation and she wishes to proceed.  2) will start miralax 3) continue  current.   Electronic Signatures: Barnetta ChapelSkulskie, Dragan Tamburrino (MD)  (Signed 07-Feb-15 10:54)  Authored: Chief Complaint, VITAL SIGNS/ANCILLARY NOTES, Brief Assessment, Lab Results, Assessment/Plan   Last Updated: 07-Feb-15 10:54 by Barnetta ChapelSkulskie, Luisdaniel Kenton (MD)

## 2014-11-08 NOTE — Consult Note (Signed)
Chief Complaint:  Subjective/Chief Complaint still c/o constipation.  no n/v.  mild rlq discomfort   VITAL SIGNS/ANCILLARY NOTES: **Vital Signs.:   09-Feb-15 05:02  Vital Signs Type Routine  Temperature Temperature (F) 98.2  Celsius 36.7  Temperature Source oral  Pulse Pulse 96  Respirations Respirations 18  Systolic BP Systolic BP 115  Diastolic BP (mmHg) Diastolic BP (mmHg) 70  Mean BP 85  Pulse Ox % Pulse Ox % 95  Pulse Ox Activity Level  At rest  Oxygen Delivery Room Air/ 21 %   Brief Assessment:  Cardiac Regular   Respiratory clear BS   Gastrointestinal details normal Soft  Nondistended  No masses palpable  Bowel sounds normal  No rebound tenderness  mild discomfort rlq to palpation   Assessment/Plan:  Assessment/Plan:  Assessment 1) anemia, heme positive stool- egd today 2) constipation.   Plan 1) as above.  I have discussed the risks benefits and complications of egd to include not limited to bleeding infection perforation and sedation and she wishes to proceed.  further recs to follow.   Electronic Signatures: Barnetta ChapelSkulskie, Anja Neuzil (MD)  (Signed 09-Feb-15 07:30)  Authored: Chief Complaint, VITAL SIGNS/ANCILLARY NOTES, Brief Assessment, Assessment/Plan   Last Updated: 09-Feb-15 07:30 by Barnetta ChapelSkulskie, Cheron Coryell (MD)

## 2014-11-08 NOTE — Consult Note (Signed)
Chief Complaint:  Subjective/Chief Complaint I was notified of patietn being in the hospital today.  She was to be seen in [facility] as o/p yesterday, but missed due to hospitalization yesterday.  Admitted with sob and weakness.  I last saw patient in [facility] a week ago.   Finals on path received after that visit.  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   Patient seen and case discussed with family and patient. Awaiting further input from oncology.  Awaiting further lab results.  I will follow at a distance.  Thank you Dr Verdell Carmine for notifying me of this patients admission.   VITAL SIGNS/ANCILLARY NOTES: **Vital Signs.:   19-Feb-15 13:08  Vital Signs Type Routine  Celsius 36.3  Temperature Source oral  Pulse Pulse 104  Respirations Respirations 20  Systolic BP Systolic BP 197  Diastolic BP (mmHg) Diastolic BP (mmHg) 72  Mean BP 88  Pulse Ox % Pulse Ox % 97  Pulse Ox Activity Level  At rest  Oxygen Delivery 2L   Brief Assessment:  Cardiac Regular   Respiratory clear BS   Gastrointestinal details normal Bowel sounds normal  No rebound tenderness  No gaurding  mild to moderate distension (change from soft last week) mild tenderness in the lower abdomen   Lab Results:  LabObservation:  19-Feb-15 08:01   OBSERVATION Reason for Test  Hepatic:  18-Feb-15 10:28   Bilirubin, Total 0.4  Alkaline Phosphatase  240 (45-117 NOTE: New Reference Range 06/07/13)  SGPT (ALT) 38  SGOT (AST)  62  Total Protein, Serum  6.0  Albumin, Serum  2.0  Routine Chem:  19-Feb-15 04:21   Glucose, Serum 77  BUN  60  Creatinine (comp)  2.80  Sodium, Serum  133  Potassium, Serum  5.4  Chloride, Serum 101  CO2, Serum 26  Calcium (Total), Serum 9.2  Anion Gap  6  Osmolality (calc) 282  eGFR (African American)  17  eGFR (Non-African American)  15 (eGFR values <47m/min/1.73 m2 may be an indication of  chronic kidney disease (CKD). Calculated eGFR is useful in patients with stable renal function. The eGFR calculation will not be reliable in acutely ill patients when serum creatinine is changing rapidly. It is not useful in  patients on dialysis. The eGFR calculation may not be applicable to patients at the low and high extremes of body sizes, pregnant women, and vegetarians.)  Routine Coag:  19-Feb-15 04:21   Activated PTT (APTT)  47.5 (A HCT value >55% may artifactually increase the APTT. In one study, the increase was an average of 19%. Reference: "Effect on Routine and Special Coagulation Testing Values of Citrate Anticoagulant Adjustment in Patients with High HCT Values." American Journal of Clinical Pathology 2006;126:400-405.)  Routine Hem:  18-Feb-15 10:28   Hemoglobin (CBC)  7.6    22:23   Hemoglobin (CBC)  7.5  19-Feb-15 04:21   WBC (CBC)  15.5  RBC (CBC)  2.56  Hemoglobin (CBC)  7.4  Hematocrit (CBC)  21.6  Platelet Count (CBC) 315  MCV 85  MCH 29.0  MCHC 34.2  RDW  15.4  Neutrophil % 79.6  Lymphocyte % 9.2  Monocyte % 7.5  Eosinophil % 3.4  Basophil % 0.3  Neutrophil #  12.3  Lymphocyte # 1.4  Monocyte #  1.2  Eosinophil # 0.5  Basophil # 0.0 (Result(s) reported on 05 Sep 2013 at 0[facility])   Radiology Results: [place]    18-Feb-15  14:22, [place] Color Flow Doppler Low Extrem Bilat (Legs)  [place] Color Flow Doppler Low Extrem Bilat (Legs)   REASON FOR EXAM:    Swelling  COMMENTS:       PROCEDURE: [place]  - US DOPPLER LOW EXTR BILATERAL  - Sep 04 2013  2:22PM     CLINICAL DATA:  Lower extremity edema.    EXAM:  BILATERAL LOWER EXTREMITY VENOUS DOPPLER ULTRASOUND    TECHNIQUE:  Gray-scale sonography with graded compression, as well as color  Doppler and duplex ultrasound, were performed to evaluate the deep  venous system from the level of the common femoral vein through the  popliteal and proximal calf veins. Spectral Doppler was utilized to  evaluate flow at  rest and with distal augmentation maneuvers.    COMPARISON:  None.    FINDINGS:  Thrombus within deep veins:  None visualized.    Compressibility of deep veins:  Normal.    Duplex waveform respiratory phasicity:  Normal.    Duplex waveform response to augmentation:  Normal.    Venous reflux:  None visualized.  Other findings: No evidence of superficial thrombophlebitis or  abnormal fluid collection. Subcutaneous edema is identified in both  calves.     IMPRESSION:  No evidence of DVT in either lower extremity.      Electronically Signed    By: Aletta Edouard M.D.    On: 09/04/2013 15:04         Verified By: Azzie Roup, M.D.,    18-Feb-15 15:49, US Abdomen General Survey  US Abdomen General Survey   REASON FOR EXAM:    renal failrue, elevated transmianses  COMMENTS:   May transport without cardiac monitor    PROCEDURE: [place]  - US ABDOMEN GENERAL SURVEY  - Sep 04 2013  3:49PM     CLINICAL DATA:  Renal failure.  Elevated transaminases.    EXAM:  ULTRASOUND ABDOMEN COMPLETE    COMPARISON:  None.    FINDINGS:  Gallbladder:  There is fluid fluid level within the dependent gallbladder  compatible with sludge. No shadowing stone seen. Mild wall  thickening at 3-4 mm. No focal gallbladder tenderness.    Common bile duct:    Diameter: 9 mm. The CBD is not seen at the level of the pancreatic  head.    Liver:    There are numerous masses throughout the liver, resulting in a  lobulated capsule. The measured lesions include: 1.8 cm in the low  left lobe, 1.2 cm in the posterior left lobe, 3.4 cm in the central  right lobe the, and 2.6 cm in the more posterior right lobe. These  masses have peripheral hypoechoic halo.    IVC:    Patent where seen    Pancreas:    There is pancreatic ductal dilatationup to 7 mm, abruptly tapering  in the region of the neck were there is hypoechoic thickening.  Hypoechoic mass ventral to the pancreas is suspicious  for  peripancreatic adenopathy.    Spleen:  Size and appearance within normal limits.    Right Kidney:    Length: 10 cm.  Mildly echogenic cortex.  No hydronephrosis.    Left Kidney:    Length: 10 cm..  Mildly echogenic cortex.  No hydronephrosis.    Abdominal aorta:    There is diffuse atherosclerotic irregularity and shadowing  involving the proximal aorta. The mid/distal aorta is obscured by  bowel gas.  Other findings:    Small simple appearing abdominal ascites. There  is a simple  appearing pleural effusion bilaterally.     IMPRESSION:  1. Pancreatic mass with CBD and pancreatic duct obstruction, likely  adenocarcinoma. There are also numerous hepatic masses/metastases.  Infused CT recommended to confirm, when renal function permits.  2. Gallbladder sludge.  No evidence of acute cholecystitis.  3. Ascites and bilateral pleural effusion.      Electronically Signed    By: Jorje Guild M.D.    On: 09/04/2013 16:00         Verified By: Gilford Silvius, M.D.,  Nuclear Med:    19-Feb-15 09:52, Lung VQ Scan - Nuc Med  Lung VQ Scan - Nuc Med   REASON FOR EXAM:    dyspnea, elevated ddimer, renal failure  COMMENTS:       PROCEDURE: NM  - NM VQ LUNG SCAN  - Sep 05 2013  9:52AM     CLINICAL DATA:  Dyspnea.  Elevated D-dimer.    EXAM:  NUCLEAR MEDICINE VENTILATION - PERFUSION LUNG SCAN    TECHNIQUE:  Ventilation images were obtained in multiple projections using  inhaled aerosol technetium 99 M DTPA. Perfusion images were obtained  in multiple projections after intravenous injection of Tc-8mMAA.  COMPARISON:  Chest x-ray dated 09/04/2013    RADIOPHARMACEUTICALS:  39.7 mCi Tc-940mTPA aerosol and 4.35 mCi  Tc-9924mA    FINDINGS:  Ventilation: There is decreased ventilation superior medially on the  left, visible on anterior and posterior views.    Perfusion: There is a matched perfusion defect superior medially in  the left lung. No other discrete  perfusion abnormalities.     IMPRESSION:  Single matched ventilation perfusion defect in the superior medial  aspect of the left lung. Low probability of pulmonary embolism.  Electronically Signed    By: JimRozetta NunneryD.    On: 09/05/2013 10:02         Verified By: JAMLarey Seat.D.,   Assessment/Plan:  Assessment/Plan:  Assessment as noted above.   Electronic Signatures for Addendum Section:  SkuLoistine SimasD) (Signed Addendum 19-Feb-15 14:37)  pateitn with poor appetite, discussed with DR SaiVerdell Carmineill start ensure as tolerated.   Electronic Signatures: SkuLoistine SimasD)  (Signed 19-Feb-15 14:45)  Authored: Chief Complaint, VITAL SIGNS/ANCILLARY NOTES, Brief Assessment, Lab Results, Radiology Results, Assessment/Plan   Last Updated: 19-Feb-15 14:45 by SkuLoistine SimasD)

## 2014-11-08 NOTE — Discharge Summary (Signed)
PATIENT NAME:  Ana Higgins, Ana Higgins MR#:  [id number] DATE OF BIRTH:  13-Mar-1926  DATE OF ADMISSION:  08/22/2013 DATE OF DISCHARGE:  08/27/2013  PRIMARY CARE PHYSICIAN:  Dr. Dale Sheffield.  FINAL DIAGNOSES: 1.  Acute blood loss anemia, iron deficiency.  2.  Duodenal ulcers and monilial esophagitis.  3.  Diabetes.  4.  Postherpetic neuralgia.  5.  Acute renal failure, which improved.  6.  Hypertension.  7.  Hyponatremia.  8.  Constipation.   MEDICATIONS ON DISCHARGE: Include ferrous fumarate/iron polysaccharide 162 mg/115.2 mg oral capsule once a day, fish oil 1000 mg twice a day, Januvia 100 mg daily, olmesartan 20 mg daily, Lantus 10 units subcutaneous injection at bedtime, NovoLog 4 units subcutaneous injection prior to meals, amlodipine 2.5 mg daily, calcium and vitamin D 1 tablet daily, vitamin C 500 mg daily, gabapentin 300 mg 3 times a day, Protonix 40 mg twice a day, fluconazole 100 mg 1 tablet daily for 14 days, lactulose 30 mL once a day as needed for constipation. Stop hydrochlorothiazide. Stop aspirin.   DIET: Low sodium, carbohydrate-controlled diet, regular consistency.   ACTIVITY: As tolerated.   Follow up with Dr. Marva Panda in 1 week for biopsy results, 1 to 2 weeks with your medical doctor.   HOSPITAL COURSE:  Date of admission 08/22/2013, date of discharge 08/27/2013, date of endoscopy is 08/26/2013.    The patient came in with severe generalized weakness, found to be anemic with a hemoglobin of 6.9; also, acute renal insufficiency, and the patient was started on IV fluid hydration.   LABORATORY AND RADIOLOGICAL DATA DURING THE HOSPITAL COURSE: Included a glucose of 382. Retic count of 3.32. Iron serum 20, iron saturations 10, TIBC 195. White blood cell count 9.3, H and H 6.9 and 21.1, platelet count of 365. BUN 40, creatinine 1.55, sodium 129, potassium 4.5, chloride 94, CO2 of 28, calcium 8.3. Urinalysis: 3+ blood.  EKG: Normal sinus rhythm, right bundle branch block.  Hemoglobin on the 6th up to 8.1. Hemoglobin on the 9th dipped down to 7.5. Hemoglobin upon discharge 7.9. Last creatinine 1.29.   HOSPITAL COURSE PER PROBLEM LIST:  1. For the patient's acute blood loss anemia, iron deficiency in nature, the patient was transfused a total of 3 packed red blood cells during the hospital course. Hemoglobin upon discharge is 7.9. The patient will be on iron supplementation as outpatient. Advised to stop the aspirin. Follow up with GI as outpatient to check blood counts.  2.  Duodenal ulcers and monilial esophagitis that was found on endoscopy on February 9th. Dr. Marva Panda was concerned about the visual appearance of the duodenal ulcers, so pathology needs to be followed up as outpatient with close clinical monitoring. I did give the patient a course of Diflucan for the monilial esophagitis. The patient will be on Protonix twice a day for ulcers to heal.  3.  Diabetes. Continue her usual medications.  4.  Postherpetic neuralgia. The patient's gabapentin was increased to 300 mg 3 times a day for acute renal failure. This had improved. The patient also had hyponatremia on presentation, and that improved with IV fluid hydration also.  5.  Hypertension. Blood pressure stable during the hospitalization.  6.  Hyponatremia. This had improved with IV fluid hydration.  7.  Constipation, likely secondary to the pain medications that she was taking in the hospital. We did get her bowels to move prior to discharge.   TIME SPENT ON DISCHARGE: 35 minutes.   ____________________________ Herschell Dimes. Renae Gloss, MD  rjw:dmm D: 08/28/2013 11:54:00 ET T: 08/28/2013 12:01:56 ET JOB#: [id number]  cc: Dale Durhamharlene Scott, MD Christena DeemMartin U. Skulskie, MD Herschell Dimesichard J. Renae GlossWieting, MD, <Dictator> Salley ScarletICHARD J Clary Boulais MD ELECTRONICALLY SIGNED 03-15-2014 12:16

## 2014-11-08 NOTE — H&P (Signed)
PATIENT NAME:  Ana Higgins, [name] MR#:  [id number] DATE OF BIRTH:  Jun 21, 1926  DATE OF ADMISSION:  09/04/2013  PRIMARY CARE PHYSICIAN: Dr. Dale Durhamharlene Scott   HISTORY OF PRESENT ILLNESS:  The patient is a 81 year old African American female with past medical history significant for history of recent admission to the hospital for acute posthemorrhagic anemia, where she was transfused approximately 3 units of packed red blood cells and given IV fluids. She also has a history of admission to the Emergency Room on 08/21/2013 for dehydration and given some IV fluids. Comes back to the hospital with complaints of worsening shortness of breath.  According to the patient, she was discharged on 08/27/2013.  She has been having progressive shortness of breath since her discharge and she also has been noticing significant weakness and lower extremity swelling.  Because of this, she decided to come to the hospital for further evaluation. In the Emergency Room, she was noted to have elevated transaminases as well as elevation of creatinine to 2.85. Her potassium level was also noted to be high at 5.6 and hospitalist services were contacted for admission.   PAST MEDICAL HISTORY: Significant for history of recent admission to the Emergency Room on 08/21/2013 for mild renal insufficiency. She was given IV fluids.  On 08/21/2013 discharged back home. She was admitted to the hospital on  08/22/2013 through 08/27/2013 for acute posthemorrhagic anemia.  She was given 3 units of packed red blood cells and underwent gastrointestinal evaluation. She was noted to have a duodenal ulcer as well as monilial esophagitis and discharged on PPI,  iron supplementation and Diflucan.  Past medical history is also significant for history of peptic ulcer disease diagnosed during her past admission with duodenal ulcers also monilial esophagitis, hypertension, hyperlipidemia, diabetes mellitus, osteoporosis, history of Raynaud phenomenon.     PAST  SURGICAL HISTORY: Total hysterectomy, hammertoe surgery, parathyroidectomy.    ALLERGIES:  MYCINS.  MEDICATIONS: According to medical records, the patient is on amlodipine 2.5 mg p.o. daily.  Calcium with vitamin D 500/200, one tablet once daily, iron  capsule once daily, fish oil 1 gram twice daily, fluconazole 100 mg p.o. daily for 14 days to be finished the 09/09/2013, gabapentin 300 mg p.o. 3 times daily, Januvia 100 mg p.o. daily, lactulose 30 mL once daily as needed, Lantus 10 units subcutaneously at bedtime, NovoLog sliding scale and 4 units subcutaneously before meals, ondansetron 20 mg p.o. daily, pantoprazole 40 mg p.o. twice daily, vitamin C 500 mg p.o. daily.   SOCIAL HISTORY: No smoking, alcohol abuse or illicit drug abuse. Lives by herself.  [place] are her family members who live close by.   FAMILY HISTORY: The patient's mother is deceased, nonspecified cardiac disease. The patient's father died of malignancy of unknown primary.   REVIEW OF SYSTEMS: The patient admits to having weakness and fatigue, pains in her left side area, left flank.  The patient's family suspect that she lost approximately 10 pounds over the past one week, poor appetite. The patient attributes the left-sided pain to left-sided shingles for which she takes medications, gabapentin.  She admits to having cataract surgery in the past. History of shortness of breath worsening over the past few days.  For the past one week also had   wheezes, dyspnea on exertion as well as orthopnea. She needs to use a few pillows to sleep. Admits of having lower extremity swelling which seems to be worsening over the past one week since discharge. Also admits to palpitations, arrhythmias, especially  on exertion, intermittent constipation with diarrhea. Denies any high fevers or chills, weight loss or gain.  EYES: Denies any blurry vision, double vision or glaucoma.  ENT: Denies any tinnitus, allergies, epistaxis, sinus pain, dentures or  difficulty swallowing. RESPIRATORY:  Denies any cough, hemoptysis, phlegm production.  CARDIOVASCULAR: Denies chest pains, orthopnea, arrhythmias, or syncope.  GASTROINTESTINAL: Denies nausea, vomiting, rectal bleeding, change in bowel habits.  GENITOURINARY: Denies dysuria, hematuria, frequency, incontinence.  ENDOCRINE: Denies polydipsia, nocturia, thyroid problems, heat or cold intolerance or thirst.  HEMATOLOGIC: Denies anemia, easy bruising, bleeding or swollen glands.  SKIN:  Denies acne, rash, lesions or changes in moles.  MUSCULOSKELETAL:  Denies arthritis, cramps, swelling, gout.  NEUROLOGIC: No numbness, epilepsy or tremors.  PSYCHIATRIC: Denies anxiety, insomnia, or depression.   PHYSICAL EXAMINATION: VITAL SIGNS: On arrival to the hospital, temperature was 97.3, pulse was 105. Respiration was 20, blood pressure 130/57, saturation was 100% on room air.  GENERAL: This is a well-developed, well-nourished African American female in no significant distress, lying on the stretcher.,  HEENT:  Her pupils are equal and reactive to light.  Extraocular muscles are intact. No icterus or conjunctivitis.  Normal hearing.  No pharyngeal erythema. Mucosa is moist.  NECK: No masses. Supple, nontender. The patient does have adenopathy on the left side of her neck as well as lymph node palpated in the lower part of her left lip area. No JVD. No carotid bruits bilaterally. Full range of motion.  LUNGS: Mildly diminished breath sounds. A few rales, rhonchi, as well as crackles were heard.  No significant wheezing or labored inspirations.  No dullness to percussion. Not in overt respiratory distress.  CARDIOVASCULAR: S1, S2 appreciated. Rythm is regular, no gallops or rubs.  PMI is not lateralized.  Chest is nontender to palpation.   EXTREMITIES:  1+ pedal pulses. 2 to 3+ lower extremity edema.  No calf tenderness or cyanosis was noted.  A well-developed, and rhythm was regular chest is nontender to  palpation.  ABDOMEN: Soft, nontender in the lower part; however, the patient does have some tenderness in upper part, especially just below rib area medially, also some in  right upper quadrant as well as left upper quadrant but no rebound or guarding were noted. Bowel sounds are present. They are diminished.  RECTAL: Deferred.  MUSCLE STRENGTH: Able to move all extremities. No cyanosis, degenerative joint disease or kyphosis. Gait is not tested.  SKIN: Did not reveal any rashes, lesions, erythema, nodularity or induration. It was warm and dry to palpation.  LYMPHATIC: No cervical region.  NEUROLOGICAL: Cranial nerves grossly intact. Sensory is grossly intact. No dysarthria or aphasia. The patient is very somnolent, oriented to time, person and place, cooperative. Memory is good.  PSYCHIATRIC: No significant confusion, agitation or depression noted.   LABORATORY DATA:  BMP showed a glucose of 215. Beta-type natriuretic peptide was 3702. BUN and creatinine were 61 and 2.85, sodium 134, potassium 5.6. Estimated GFR for an African American would be 17. Liver enzymes revealed albumin level of 2.0, alkaline phosphatase 240, AST elevation of 62. Troponin was less than 0.02. White blood cell count is elevated to 14.7, hemoglobin was 7.6 and platelet count was 345.  Absolute neutrophil count was not checked and differential is not done.  Urinalysis is not taken.  Chest x-ray, portable single view, 09/04/2013 showed new increased density in left lung base consistent with atelectasis or pneumonia. Small left pleural effusion and trace right pleural effusion were present and are new according to  radiologist.   ASSESSMENT AND PLAN: 1.  Shortness of breath, possibly related to fluid retention, fluid overload in this patient with acute renal failure and questionable pneumonia related. Admit the patient to the medical floor. Continue oxygen therapy. We will initiate the patient on Levaquin for questionable pneumonia.  We will also get D-dimer and get V-Q scan if needed. We will get Doppler ultrasound of lower extremities to rule out deep vein thrombosis. Will continue the patient on oxygen therapy as well as Lasix together.  2.  Acute renal failure. We will continue IV fluids and follow her for possible overload,  intermittently giving Lasix IV. Get echocardiogram, if it was not done recently. Get nephrologist involved. Get ultrasound of kidneys and urinalysis to rule out urinary tract infection.  3.  Lower extremity swelling Doppler ultrasound. Get echocardiogram. The patient will need Lasix IV intermittently.  4.  Hyperkalemia. Get Lasix and continue IV fluids for now.   TIME SPENT:  1 hour on dictation.   ____________________________ Katharina Caper, MD rv:dp D: 09/04/2013 13:24:49 ET T: 09/04/2013 14:51:03 ET JOB#: [id number]  cc: Katharina Caper, MD, <Dictator> Dale Gu-Win, MD Moss Berry MD ELECTRONICALLY SIGNED 09/19/2013 18:09

## 2014-11-08 NOTE — Consult Note (Signed)
History of Present Illness:  Reason for Consult Pancreatic cancer   HPI   Ms. Hennigan is an 81 yo woman with PMH of HTN, HLD, PUD, postherpetic neuralgia, osteoporosis, h/o Raynaud's, s/p total hysterectomy, hammertoe sgy, and parathyroidectomy. Pt was recently hospitalized 2/5-2/10 with GI bleed and underwent EGD on 2/9 and was found to have monilial esophagitis and multiple atypical duodenal ulcers. Biopsy was c/w poorly differentiated carcinoma. Patient was pending referral to surgery for core biopsy of breast due to abnormal mammogram. She is now readmitted on 2/18 with shortness of breath and was found to have a large pancreatic mass and numerous hepatic masses on abdominal [email or web address] of pain across left flank and poor appetite.very weak.  PFSH:  Family History noncontributory   Social History negative alcohol, negative tobacco   Comments Lives alone with nieces nearby Retired Marine scientist   Review of Systems:  General weakness  fatigue   Performance Status (ECOG) 3   HEENT no complaints   Lungs SOB   Cardiac no complaints   GI pain  left flank 6/10   GU no complaints   Musculoskeletal no complaints   Extremities swelling   Skin no complaints   Neuro no complaints   Endocrine no complaints   Psych no complaints   NURSING NOTES: ED Vital Sign Flow Sheet:   18-Feb-15 15:34   Pulse Pulse: 98   Respirations Respirations: 18   SBP SBP: 132   Pulse Ox % Pulse Ox %: 98   Pulse Ox Source Source: Oxygen   Pain Scale (0-10) Pain Scale (0-10): Scale:0  NURSING NOTES: **Vital Signs.:   19-Feb-15 13:08   Vital Signs Type: Routine   Temperature Temperature (F): 97.5   Celsius: 36.3   Temperature Source: oral   Pulse Pulse: 104   Respirations Respirations: 20   Systolic BP Systolic BP: 224   Diastolic BP (mmHg) Diastolic BP (mmHg): 72   Mean BP: 88   Pulse Ox % Pulse Ox %: 97   Pulse Ox Activity Level: At rest   Oxygen Delivery: 2L   Physical  Exam:  General Ill looking pleasant female in some obvious pain   HEENT: normal   Lungs: clear   Cardiac: regular rate, rhythm   Breast: not examined   Abdomen: soft  positive bowel sounds  distended  peritoneal nodule right upper quadrant   Skin: intact   Extremities: edema   Neuro: AAOx3   Psych: normal appearance    Other- Explain in Comments Line: Unknown  Erythromycin: Blisters  Laboratory Results:  Routine Chem:  19-Feb-15 04:21   Glucose, Serum 77  BUN  60  Creatinine (comp)  2.80  Sodium, Serum  133  Potassium, Serum  5.4  Chloride, Serum 101  CO2, Serum 26  Calcium (Total), Serum 9.2  Anion Gap  6  Osmolality (calc) 282  eGFR (African American)  17  eGFR (Non-African American)  15 (eGFR values <42m/min/1.73 m2 may be an indication of chronic kidney disease (CKD). Calculated eGFR is useful in patients with stable renal function. The eGFR calculation will not be reliable in acutely ill patients when serum creatinine is changing rapidly. It is not useful in  patients on dialysis. The eGFR calculation may not be applicable to patients at the low and high extremes of body sizes, pregnant women, and vegetarians.)  Routine Coag:  19-Feb-15 04:21   Activated PTT (APTT)  47.5 (A HCT value >55% may artifactually increase the APTT. In one study, the increase was an average  of 19%. Reference: "Effect on Routine and Special Coagulation Testing Values of Citrate Anticoagulant Adjustment in Patients with High HCT Values." American Journal of Clinical Pathology 2006;126:400-405.)  Routine Hem:  19-Feb-15 04:21   WBC (CBC)  15.5  RBC (CBC)  2.56  Hemoglobin (CBC)  7.4  Hematocrit (CBC)  21.6  Platelet Count (CBC) 315  MCV 85  MCH 29.0  MCHC 34.2  RDW  15.4  Neutrophil % 79.6  Lymphocyte % 9.2  Monocyte % 7.5  Eosinophil % 3.4  Basophil % 0.3  Neutrophil #  12.3  Lymphocyte # 1.4  Monocyte #  1.2  Eosinophil # 0.5  Basophil # 0.0 (Result(s) reported  on 05 Sep 2013 at 05:28AM.)   Assessment and Plan: Impression:   Elderly female woth pancreatic mass with liver metastases as well as ascites and peritoneal nodules Plan:   I had a long discussion with patient and her niece,.explained to them that this was 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 Plan for diagnostic paracentesis and await Ca19-9.start on Fentanyl patch 54mg.also benefit from palliative care followup.  Electronic Signatures: RGeorges Mouse(MD)  (Signed 19-Feb-15 14:27)  Authored: HISTORY OF PRESENT ILLNESS, PFSH, ROS, NURSING NOTES, PE, ALLERGIES, HOME MEDICATIONS, LABS, ASSESSMENT AND PLAN   Last Updated: 19-Feb-15 14:27 by RGeorges Mouse(MD)

## 2014-11-08 NOTE — Consult Note (Signed)
Chief Complaint:  Subjective/Chief Complaint Please see full GI consutl and brief consult note.  Patient admitted with weakness and anemia with heme positive stool.  Recent Nsaid use.  Patient with multiple year h/o IDA with uninformative gi evalu in the past.  No melena or hematemesis.   Recommend serial hgb, transfuse as needed, will arrange for egd monday unless there is a clinical change.  Following.   VITAL SIGNS/ANCILLARY NOTES: **Vital Signs.:   06-Feb-15 21:13  Vital Signs Type Routine  Temperature Temperature (F) 98.6  Celsius 37  Temperature Source oral  Pulse Pulse 100  Respirations Respirations 18  Systolic BP Systolic BP 145  Diastolic BP (mmHg) Diastolic BP (mmHg) 69  Mean BP 94  Pulse Ox % Pulse Ox % 98  Pulse Ox Activity Level  At rest  Oxygen Delivery Room Air/ 21 %   Brief Assessment:  Cardiac Regular   Respiratory clear BS   Gastrointestinal details normal Soft  Nontender  Nondistended  Bowel sounds normal   Lab Results: Routine Hem:  05-Feb-15 13:51   Hemoglobin (CBC)  6.9  Platelet Count (CBC) 365 (Result(s) reported on 22 Aug 2013 at 02:08PM.)  MCV 82  Retic Count  3.32  06-Feb-15 06:33   Hemoglobin (CBC)  8.1  Platelet Count (CBC) 356   Electronic Signatures: Barnetta ChapelSkulskie, Martin (MD)  (Signed 06-Feb-15 22:13)  Authored: Chief Complaint, VITAL SIGNS/ANCILLARY NOTES, Brief Assessment, Lab Results   Last Updated: 06-Feb-15 22:13 by Barnetta ChapelSkulskie, Martin (MD)

## 2014-11-08 NOTE — Consult Note (Signed)
PATIENT NAME:  Ana Higgins, Ana Higgins MR#:  [id number] DATE OF BIRTH:  1925/10/27  DATE OF CONSULTATION:  08/23/2013  REFERRING PHYSICIAN:      Dr. Heron NayVasireddy CONSULTING PHYSICIAN:  Keturah Barrehristiane H. Paddy Walthall, NP  GI consult ordered by Dr. Heron NayVasireddy to evaluate for iron deficiency anemia.   I appreciate consult for an 81 year old African American woman with history of IDA for evaluation of the same, was admitted yesterday for severe generalized weakness. She has had prior EGD colonoscopy 2006 with hemorrhoids and gastritis, most recently 2012 with diverticula/hemorrhoids, gastritis and esophagitis. Has not had VCE to date. Was admitted with hemoglobin of 6.9, last was 11 to 12, weakness and heme-positive stool. Reports recent history of shingles to her left back. States the rash has dried up but that she has been having persistent pain in the site. Has been taking 12-hour version of Aleve for this on a fairly regular basis. Was started on Neurontin by her PCP. Does report having some dark stools, but attributed this to her iron therapy that she takes. Has not had any abdominal pain, uncontrolled dyspepsia or other GI complaints until today when she had some lower abdominal pain this morning relieved with pain med. States it has been about a week since her last bowel movement. She states her weakness was some better after receiving a unit of blood today. PT-INR normal. Platelet count was normal. Iron studies here showing deficiency. Last hemoglobin 8.1.  PAST MEDICAL HISTORY: Hypertension, hyperlipidemia, IDA, diabetes, osteoporosis, Raynaud's, total hysterectomy, hammertoe surgery, parathyroidectomy.   ALLERGIES: TO -MYCINS.   HOME MEDICATIONS: Vitamin C 500 mg p.o. daily, olmesartan 20 mg p.o. daily, Protonix 40 mg once a day, NovoLog 4 units 3 times daily, Lantus 14 units daily, Januvia 100 mg once a day, HCTZ 25 mg p.o. daily, gabapentin 100 mg in the morning, 300 mg at bedtime; fish oil 1000 mg b.i.d., tandem 1  cap daily, calcium with D once a day, baby aspirin once a day, amlodipine 2.5 once day.   SOCIAL HISTORY: No EtOH, tobacco, illicits. Lives alone.   FAMILY HISTORY: Father with prostate cancer. Sister with peptic ulcer disease. Mother deceased from cardiac issues. No history of liver disease. No known history of colorectal cancer or colon polyps.  REVIEW OF SYSTEMS:  Ten systems reviewed, significant only for generalized weakness and lightheadedness, which has improved somewhat since blood transfusion. Left scapular pain related to her recent reported history of shingles. States the rash has resolved.   LABORATORY DATA: Most recent labs: Glucose 193, BUN 32, creatinine 1.29. Sodium 132, potassium 4.3, chloride 101, GFR 44, iron 20, TIBC 195, iron saturation 10. Calcium 8.3, total protein 6.8, albumin 2.9, total bilirubin 0.2, ALP 105, AST 31, ALT 36. WBC 9.6, hemoglobin 8.1, hematocrit 23.2, platelet count normal. PT 12.8, INR 1.0, retic count 3.32. Do note hematuria 3+. Do note ultrasound of breast mass on 01/30, is planning to have biopsy for this.   PHYSICAL EXAMINATION: VITAL SIGNS:  Most recent:  Temp 99, respiratory rate 18, pulse 97, blood pressure 134/77, SaO2 94% on room air.  GENERAL: Well-appearing woman in no significant distress.  HEENT: Normocephalic, atraumatic. Conjunctivae are slightly pale. There is no scleral icterus.  NECK: Supple. No JVD or lymphadenopathy.  CHEST: Respirations eupneic. LUNGS: Clear.  CARDIAC: S1, S2, RRR. No MRG. No significant edema.  ABDOMEN: Flat, soft. Epigastric tenderness. No rigidity, guarding, peritoneal signs, hepatosplenomegaly or other abnormalities.  RECTAL: Deferred due to heme-positive result in ED.  SKIN: Warm, dry,  pink. Has several moles on her back. No rash. BACK:  No obvious deformities, minimally tender around the left scapular area.  MUSCULOSKELETAL: Good range of motion all extremities. Strength 5/5. No clubbing or cyanosis.   NEUROLOGIC: Alert, oriented x 3. Cranial nerves II through XII intact. Speech clear. No facial droop.   Iron deficiency anemia exacerbation.  Epigastric pain with recent NSAID use, heme-positive stool. Have discussed with Dr. Marva Panda. We will plan for EGD on Monday as clinically feasible. Do recommend following her hemoglobin, continuing proton pump inhibitor and transfusing p.r.n.  Thank you very much for this consult.  These services were provided by Vevelyn Pat, MSN, [facility], in collaboration with Barnetta Chapel, M.D. with whom I have discussed this patient in full.     ____________________________ Keturah Barre, NP chl:ce D: 08/23/2013 19:05:17 ET T: 08/23/2013 19:16:44 ET JOB#: [id number]  cc: Keturah Barre, NP, <Dictator> Eustaquio Maize Fidencio Duddy FNP ELECTRONICALLY SIGNED 09/18/2013 8:17

## 2014-11-08 NOTE — Consult Note (Signed)
Brief Consult Note: Diagnosis: weakness.   Patient was seen by consultant.   Consult note dictated.   Comments: Appreciate consult for 81 y/o [place] woman with history of IDA for evaluation of same. Has had prior EGD/colonoscopies: 2006 with hemorrhoids and gastritis; most recently 2012 with diverticula/hemorrhoids, gastritis, and esophagitis. Has not had VCE to date. Was admitted with hgb 6.9 (last was 11-12), weakness, and heme positive stool. Reports recent history of shingles to her left back- states the rash has dried up, but that she has been having persistent pain at the site. Has been taking the 12h version of Aleve for this on a fairly regular basis. Does report having some dark stools, but attributed this to her iron therapy that she takes. Has not had any abdominal pain, uncontrolled dyspepsia, or other GI complaint until today, when she had some lower abdominal pain this am, relieved with pain med. States it has been about a week since her last bowel movement. States her weakness is some better after receiving a unit of blood today. pt/inr was normal, platelet count normal.  Fe studies here showing deficiency. Last hgb 8.1. Did not epigastric tenderness on abdominal exam, otherwise abdomen benign Impression/plan: IDA exacerbation. Epigastric pain with recent NSAID use. Heme positive stool. Have discussed with Dr Marva PandaSkulskie: will plan for EGD Monday as clinically feasible.  Electronic Signatures: Vevelyn PatLondon, Jessi Pitstick H (NP)  (Signed 06-Feb-15 18:58)  Authored: Brief Consult Note   Last Updated: 06-Feb-15 18:58 by Keturah BarreLondon, Regie Bunner H (NP)

## 2014-11-08 NOTE — Consult Note (Signed)
Chief Complaint:  Subjective/Chief Complaint seen for anemia heme positive sdtool.  denies n/v or abdominal pain.  c/o constipation.  recent nsaid use   VITAL SIGNS/ANCILLARY NOTES: **Vital Signs.:   08-Feb-15 04:50  Vital Signs Type Routine  Temperature Temperature (F) 99  Celsius 37.2  Temperature Source oral  Pulse Pulse 97  Respirations Respirations 18  Systolic BP Systolic BP 127  Diastolic BP (mmHg) Diastolic BP (mmHg) 80  Mean BP 95  Pulse Ox % Pulse Ox % 94  Pulse Ox Activity Level  At rest  Oxygen Delivery Room Air/ 21 %    12:35  Pulse Pulse 97  Pulse source if not from Vital Sign Device per Telemetry Clerk; SR   Brief Assessment:  Cardiac Regular   Respiratory clear BS   Gastrointestinal details normal Soft  Nontender  Nondistended  No masses palpable  Bowel sounds normal   Lab Results: Routine Hem:  05-Feb-15 13:51   Hemoglobin (CBC)  6.9  06-Feb-15 06:33   Hemoglobin (CBC)  8.1    21:54   Hemoglobin (CBC)  7.8 (Result(s) reported on 23 Aug 2013 at 10:43PM.)  07-Feb-15 04:40   Hemoglobin (CBC)  7.7  Platelet Count (CBC) 367    13:41   Hemoglobin (CBC)  8.0 (Result(s) reported on 24 Aug 2013 at 02:13PM.)  08-Feb-15 05:06   Hemoglobin (CBC)  8.0 (Result(s) reported on 25 Aug 2013 at 05:38AM.)   Radiology Results: Cardiology:    05-Feb-15 19:31, ECG  ECG interpretation   Normal sinus rhythm  Right bundle branch block  Abnormal ECG  When compared with ECG of 11-Jan-1999 14:15,  Vent. rate has increased BY  39 BPM  QT has lengthened  (unconfirmed)  Confirmed by OVERREAD, NOT (100), editor BAILEY, TAMMY ([id number]) on 2/6/20154:18:07 PM   Assessment/Plan:  Assessment/Plan:  Assessment 1) anemia, hemepositive stool in the setting of recent nsaid use and previous h/o heme positive and anemia.  2) recent shingles, resolved, residual pain.  3) constipation.   Plan 1) egd tomorrow am.  I have discussed the risks benefits and complicartions of egd to  include not limited to bleeding infection perforation and sedation and she wishes to proceed. continue ppi.   Electronic Signatures: Barnetta ChapelSkulskie, Seraphim Trow (MD)  (Signed 08-Feb-15 14:39)  Authored: Chief Complaint, VITAL SIGNS/ANCILLARY NOTES, Brief Assessment, Lab Results, Radiology Results, Assessment/Plan   Last Updated: 08-Feb-15 14:39 by Barnetta ChapelSkulskie, Mancel Lardizabal (MD)

## 2014-11-08 NOTE — H&P (Signed)
PATIENT NAME:  Ana Higgins, Ana Higgins MR#:  [id number] DATE OF BIRTH:  17-Feb-1926  DATE OF ADMISSION:  08/22/2013  PRIMARY CARE PHYSICIAN:  Dr. Dale Tahoka.    REFERRING PHYSICIAN: Dr. Lowella Fairy.   CHIEF COMPLAINT: Severe generalized weakness.   HISTORY OF PRESENT ILLNESS:  Ana Higgins is an 81 year old African American female with past medical history of hypertension, hyperlipidemia, diabetes mellitus, was oral medication until December 2014 when the patient suffered from herpes zoster, followed by severe postherpetic neuralgia. The patient has been having increased level of sugars. The patient was initiated on Lantus. The patient continued to require increased doses of NovoLog, on sliding scale insulin. Concerning this, came to the Emergency Department yesterday. The patient was given IV fluids. We controlled her blood sugars and was discharged home this afternoon. The patient went home and was found to have blood sugars of 570. Concerning this, the patient returned back to the Emergency Department. The patient states that has been compliant with her diet and exercise, as well as medications.  The patient leads an active life. Since the herpes zoster,  unable to lie down in the bed secondary to severe pain on the left lateral aspect of the chest and back. The patient has been taking Neurontin without much improvement. On workup in the Emergency Department yesterday, the patient was found to have a hemoglobin of 7.6. Today, the patient is found to have a hemoglobin of 6.9. The patient received some IV fluids yesterday; however, the patient has known history of iron deficiency anemia. The patient's last hemoglobin in December was 11.9. Stool occult in the Emergency Department was guaiac positive. The patient denies having any gross blood. Last bowel movement was 4 days back. The patient had EGD and colonoscopy done in 2012 and was found to have gastritis, and colonoscopy were unremarkable. The patient also  states that has been experiencing severe generalized weakness. Has been feeling lightheadedness. The patient had similar symptoms when the patient had anemia in 2012.     PAST MEDICAL HISTORY: 1.  Hypertension.  2.  Hyperlipidemia.  3.  Diabetes mellitus.  4.  Osteoporosis.  5.  Diabetes mellitus, insulin-dependent.   6.  Raynaud syndrome.  PAST SURGICAL HISTORY:  1.  Total hysterectomy.  2.  Hammertoe.  3.  Parathyroidectomy.   ALLERGIES:  TO MYCINS.    HOME MEDICATIONS: 1.  Vitamin C 500 mg once a day.  2.  Protonix 40 mg once a day. 3.  Ondansetron 20 mg once a day. 4.  NovoLog 40 units 3 times a day.  5.  Lantus 14 units daily.  6.  Januvia 100 mg once a day.  7.  Hydrochlorothiazide 25 mg daily.  8.  Gabapentin 100 mg at bedtime. 9.  Fish oil 1000 mg 2 times a day. 10.  Iron polysaccharide 1 capsule once a day.  11.  Calcium with vitamin D once a day.  12.  Aspirin 81 mg once a day.  13.  Amlodipine 2.5 mg once a day.   SOCIAL HISTORY: No history of smoking, drinking alcohol or using illicit drugs. Lives by herself. Does not have any family members other than her nieces   FAMILY HISTORY:  Mother deceased, unspecified cardiac disease. Father died from malignancy of unknown primary.  REVIEW OF SYSTEMS: CONSTITUTIONAL: Generalized weakness, lightheadedness.  EYES: No change in vision.  ENT: No change in hearing. No sore throat.  RESPIRATORY: No cough, shortness of breath.  CARDIOVASCULAR: No chest pain, palpitations. No pedal  edema.  GASTROINTESTINAL: No nausea, vomiting, abdominal pain. Constipated.  GENITOURINARY: No dysuria or hematuria.  SKIN: No rash or lesions.  MUSCULOSKELETAL: No joint pains and aches.  NEUROLOGIC:  No weakness or numbness in any part of the body.   PHYSICAL EXAMINATION: GENERAL: This is a well-built, well-nourished, age-appropriate female lying down in the bed, not in distress.  VITAL SIGNS: Temperature 98.3, pulse 88, blood pressure  129/62, respiratory rate of 18, oxygen saturations 95% on room air.  HEENT: Head normocephalic, atraumatic. There is no scleral icterus. Conjunctivae normal. Pupils equal and reactive. Extraocular movements are intact. Mucous membranes: Mild dryness. No pharyngeal erythema.  NECK: Supple. No lymphadenopathy. No JVD. No carotid bruit. No thyromegaly.  CHEST: Has no focal tenderness.  LUNGS: Bilaterally clear to auscultation.  HEART: S1, S2 regular. No murmurs are heard. No pedal edema. Pulses 2+.  ABDOMEN: Bowel sounds present. Soft, nontender, nondistended. No hepatosplenomegaly.  SKIN: No rashes or lesions.  MUSCULOSKELETAL: Good range of motion in all the extremities. NEUROLOGIC:  The patient is alert, oriented to place, person and time. Cranial nerves II through XII intact. Motor 5/5 in upper and lower extremities.   LABORATORY DATA: UA negative for nitrites and leukocyte esterase. CMP: Glucose of 349, BUN 40, creatinine of 1.55. CBC: WBC of 9.3, hemoglobin 6.9, platelet count of 365.   ASSESSMENT AND PLAN: Ana Higgins is an 81 year old female who comes to the Emergency Department with severe generalized weakness.  1.  Generalized weakness. This is a combination of dehydration and anemia. We will continue giving IV fluids, control the blood sugars and transfuse 2 units of packed RBCs.  2.  Iron deficiency anemia secondary to a possible occult gastrointestinal bleed. We will transfuse. We will obtain iron profile, B12, folate, RBC and a retic count as well. Most likely, this is dilutional from yesterday to today, as the patient is on hydrochlorothiazide, as well as hyperglycemia. After the IV fluids, her hemoglobin trended down from 7.6 to 6.9. However, as the patient is symptomatic from the anemia, we will transfuse 2 units of packed RBCs. The patient had significant drop in the last 2 months from 11.9 to 6.9. Stool occult being positive, we will consult GI. The patient does not seem to have any  active bleeding at this time.  3. Hyperglycemia. This could be caused by stress from the insomnia, as well as severe pain from the postherpetic neuralgia. Neurontin does not seem to help her with the pain. Consider starting on small dose of amitriptyline and improve the sleep pattern, which might help decreasing the stress. However, increase the dose of the Lantus to 20 units. The patient on average has been requiring about 15 units.  4.  Postherpetic neuralgia. The patient is on gabapentin at this time. The patient states it is not helping her. We will start the patient on amitriptyline at 10 mg at bedtime.  5. Acute renal insufficiency secondary to dehydration. We will hold the hydrochlorothiazide and continue with IV fluids.  6.  Hypertension. Continue with the home medications.  7.  Keep the patient on deep vein thrombosis prophylaxis with sequential compression devices.   TIME SPENT: 55 minutes.      ____________________________ Susa GriffinsPadmaja Isabeau Mccalla, MD pv:dmm D: 08/22/2013 21:44:00 ET T: 08/22/2013 22:33:25 ET JOB#: [id number]  cc: Susa GriffinsPadmaja Monaca Wadas, MD, <Dictator> Dale Durhamharlene Scott, MD Susa GriffinsPADMAJA Lanitra Battaglini MD ELECTRONICALLY SIGNED 08/25/2013 4:19

## 2015-08-06 IMAGING — US US BREAST*R* LIMITED INC AXILLA
1 series · 4 of 4 positions shown · non-contrast
Comparison: Multiple priors

CLINICAL DATA: Patient with history of shingles. She has persistent
diffuse left breast pain.

EXAM:
DIGITAL DIAGNOSTIC  BILATERAL MAMMOGRAM WITH CAD
ULTRASOUND RIGHT BREAST

[Series 1: us breast*right* limited inc axilla · 0.08mm/px · 4 of 4 slices shown]
[im 1/4]
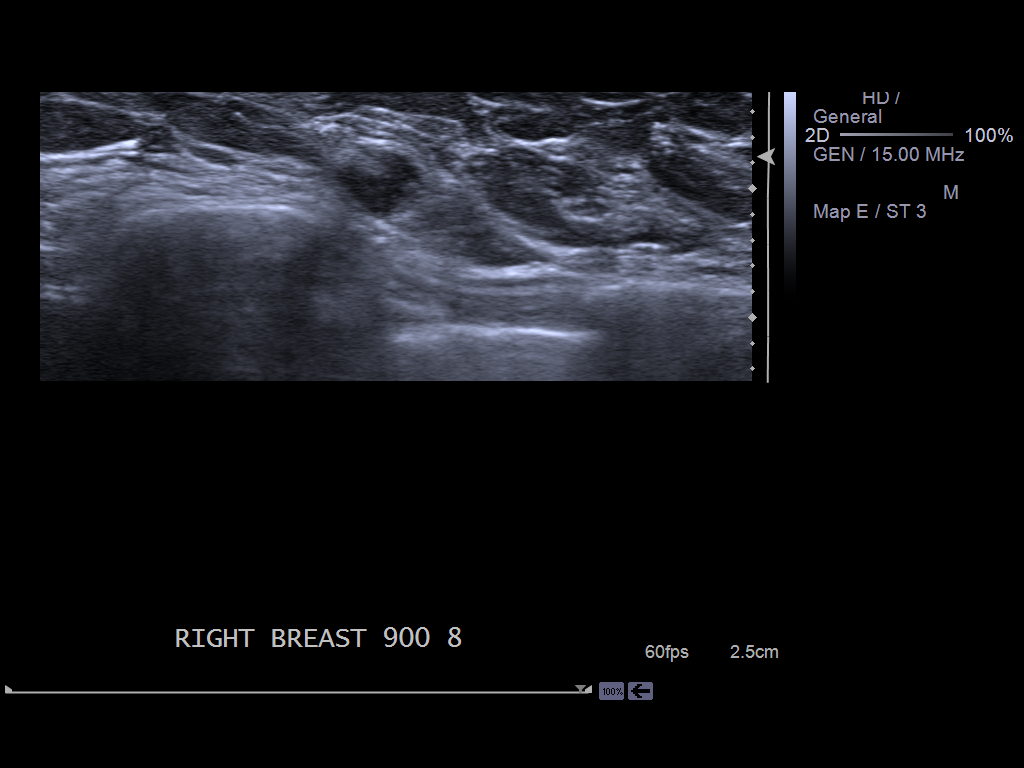
[im 2/4]
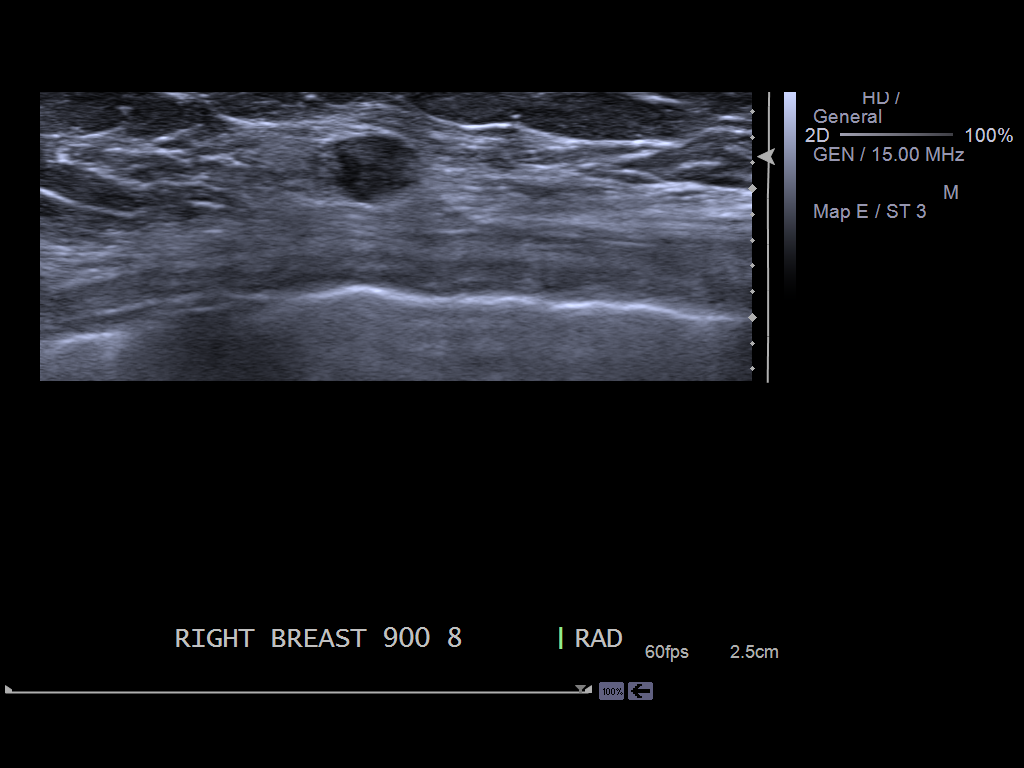
[im 3/4]
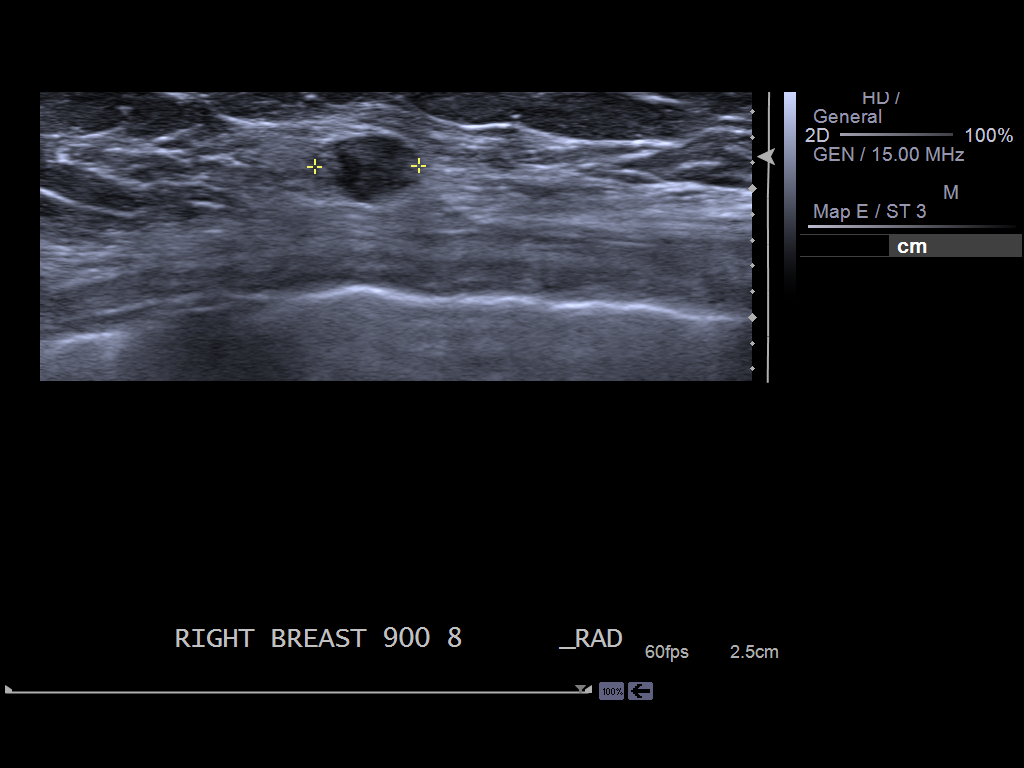
[im 4/4]
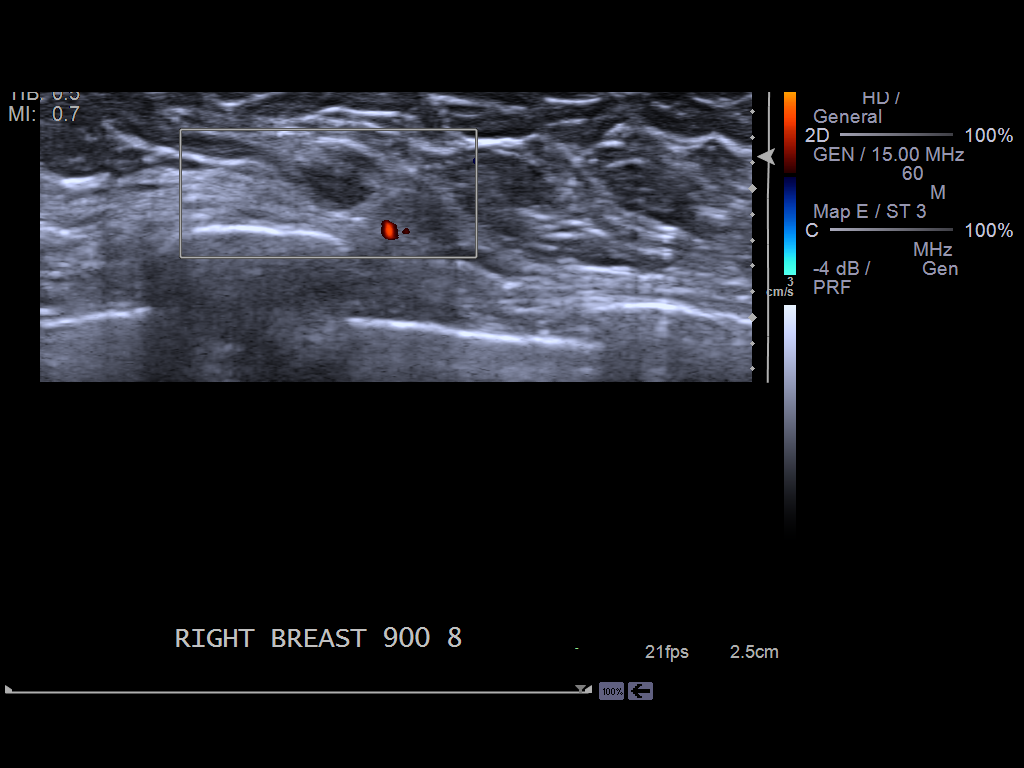

[4 of 4 positions shown; findings below may reference images not displayed]

ACR Breast Density Category b: There are scattered areas of
fibroglandular density.
FINDINGS: An asymmetry is identified in the upper posterior right breast,
measuring 4 mm. This located approximately 7 cm from the nipple.

Mammographic images were processed with CAD.

On physical exam, no palpable abnormality is identified in the area
of mammographic concern.

Targeted ultrasound of the upper breast demonstrates an ill-defined
hypoechoic mass at 9 o'clock, 8 cm from the nipple. This measures 8
x 7 x 6 mm. No internal vascularity is demonstrated. This likely
does not correspond to the mammographic abnormality.
IMPRESSION: Suspicious right breast mass at 9 o'clock, for which core needle
biopsy is recommended. This is favored to be separate from the
likely benign 4 mm upper posterior right breast asymmetry seen
mammographically.

RECOMMENDATION:
Ultrasound-guided biopsy of the right breast mass at 9 o'clock is
recommended. This will be scheduled for the patient. Further
recommendations regarding the right breast asymmetry will be made
following biopsy.

I have discussed the findings and recommendations with the patient.
Results were also provided in writing at the conclusion of the
visit.

BI-RADS CATEGORY  4: Suspicious abnormality - biopsy should be
considered.
